# Patient Record
Sex: Female | Born: 1945
Health system: Southern US, Community
[De-identification: ages and names within clinical notes are randomized; demographics above are authoritative.]

## PROBLEM LIST (undated history)

## (undated) DIAGNOSIS — R10819 Abdominal tenderness, unspecified site: Secondary | ICD-10-CM

## (undated) DIAGNOSIS — E782 Mixed hyperlipidemia: Secondary | ICD-10-CM

## (undated) DIAGNOSIS — R9389 Abnormal findings on diagnostic imaging of other specified body structures: Secondary | ICD-10-CM

## (undated) DIAGNOSIS — K589 Irritable bowel syndrome without diarrhea: Secondary | ICD-10-CM

## (undated) DIAGNOSIS — M129 Arthropathy, unspecified: Secondary | ICD-10-CM

## (undated) DIAGNOSIS — R7302 Impaired glucose tolerance (oral): Secondary | ICD-10-CM

## (undated) DIAGNOSIS — K5730 Diverticulosis of large intestine without perforation or abscess without bleeding: Secondary | ICD-10-CM

## (undated) DIAGNOSIS — E785 Hyperlipidemia, unspecified: Secondary | ICD-10-CM

## (undated) DIAGNOSIS — M1990 Unspecified osteoarthritis, unspecified site: Secondary | ICD-10-CM

## (undated) DIAGNOSIS — R102 Pelvic and perineal pain: Secondary | ICD-10-CM

## (undated) DIAGNOSIS — I10 Essential (primary) hypertension: Secondary | ICD-10-CM

## (undated) DIAGNOSIS — N882 Stricture and stenosis of cervix uteri: Secondary | ICD-10-CM

## (undated) HISTORY — PX: NO PAST SURGERIES: SHX2092

## (undated) HISTORY — PX: COLONOSCOPY: SHX174

---

## 2005-12-18 ENCOUNTER — Ambulatory Visit: Payer: Self-pay | Admitting: Obstetrics and Gynecology

## 2011-07-04 ENCOUNTER — Ambulatory Visit: Payer: Self-pay | Admitting: Obstetrics and Gynecology

## 2011-07-12 ENCOUNTER — Ambulatory Visit: Payer: Self-pay | Admitting: Gastroenterology

## 2013-12-23 ENCOUNTER — Ambulatory Visit: Payer: Self-pay | Admitting: Obstetrics and Gynecology

## 2015-12-01 ENCOUNTER — Encounter: Payer: Self-pay | Admitting: *Deleted

## 2015-12-01 ENCOUNTER — Ambulatory Visit
Admission: EM | Admit: 2015-12-01 | Discharge: 2015-12-01 | Disposition: A | Discharge status: Home / Self Care | Payer: Managed Care, Other (non HMO) | Attending: Family Medicine | Admitting: Family Medicine

## 2015-12-01 DIAGNOSIS — J0100 Acute maxillary sinusitis, unspecified: Secondary | ICD-10-CM

## 2015-12-01 HISTORY — DX: Hyperlipidemia, unspecified: E78.5

## 2015-12-01 HISTORY — DX: Essential (primary) hypertension: I10

## 2015-12-01 HISTORY — DX: Unspecified osteoarthritis, unspecified site: M19.90

## 2015-12-01 MED ORDER — AZITHROMYCIN 250 MG PO TABS
ORAL_TABLET | ORAL | Status: DC
Start: 2015-12-01 — End: 2020-04-11

## 2015-12-01 MED ORDER — FLUTICASONE PROPIONATE 50 MCG/ACT NA SUSP: 2.0000 | Freq: Every day | NASAL | Status: DC

## 2015-12-01 NOTE — ED Provider Notes (Signed)
CSN: [id number]     Arrival date & time 12/01/15  [id number] History   First MD Initiated Contact with Patient 12/01/15 [phone]     Chief Complaint  Patient presents with  . Nasal Congestion  . Ear Fullness  . Dizziness   (Consider location/radiation/quality/duration/timing/severity/associated sxs/prior Treatment) HPI   70 year old female presents with nasal congestion headache and occasional dizziness for 2 weeks. She states that she had her children grandchildren and her husband were all sick over the holidays and hers has persisted. Ends of ear pressure that also will come and go. Her husband is chest cold has cleared but hers lingers. He has occasional cough but thinks that this is mostly from postnasal drip and she states she is not coughing excessively at nighttime.  Past Medical History  Diagnosis Date  . Arthritis   . Hypertension   . Hyperlipemia    History reviewed. No pertinent past surgical history. History reviewed. No pertinent family history. Social History  Substance Use Topics  . Smoking status: Never Smoker   . Smokeless tobacco: Never Used  . Alcohol Use: No   OB History    No data available     Review of Systems  Constitutional: Positive for chills. Negative for fever, diaphoresis, activity change and fatigue.  HENT: Positive for congestion, ear pain, postnasal drip, rhinorrhea, sinus pressure and sneezing.   Respiratory: Positive for cough. Negative for shortness of breath.   All other systems reviewed and are negative.   Allergies  Review of patient's allergies indicates no known allergies.  Home Medications   Prior to Admission medications   Medication Sig Start Date End Date Taking? Authorizing Provider  aspirin 81 MG tablet Take 81 mg by mouth daily.   Yes Historical Provider, MD  rosuvastatin (CRESTOR) 20 MG tablet Take 20 mg by mouth daily.   Yes Historical Provider, MD  triamterene-hydrochlorothiazide (MAXZIDE-25) 37.5-25 MG tablet Take 1 tablet by  mouth daily.   Yes Historical Provider, MD  azithromycin (ZITHROMAX Z-PAK) 250 MG tablet Use as per package instructions 12/01/15   Lutricia FeilWilliam P Chancellor Vanderloop, PA-C  fluticasone [facility](FLONASE) 50 MCG/ACT nasal spray Place 2 sprays into both nostrils daily. 12/01/15   Lutricia FeilWilliam P Ervin Hensley, PA-C   Meds Ordered and Administered this Visit  Medications - No data to display  BP 170/88 mmHg  Pulse 87  Temp(Src) 97.3 F (36.3 C) (Oral)  Resp 18  Ht 4\' 10"  (1.473 m)  Wt 135 lb (61.236 kg)  BMI 28.22 kg/m2  SpO2 100% No data found.   Physical Exam  Constitutional: She is oriented to person, place, and time. She appears well-developed and well-nourished. No distress.  HENT:  Head: Normocephalic and atraumatic.  Right Ear: External ear normal.  Left Ear: External ear normal.  Mouth/Throat: Oropharynx is clear and moist. No oropharyngeal exudate.  Eyes: Conjunctivae are normal. Pupils are equal, round, and reactive to light.  Neck: Normal range of motion. Neck supple.  Pulmonary/Chest: Breath sounds normal. No respiratory distress. She has no wheezes. She has no rales.  Musculoskeletal: Normal range of motion. She exhibits no edema or tenderness.  Neurological: She is alert and oriented to person, place, and time.  Skin: Skin is warm and dry. She is not diaphoretic.  Psychiatric: She has a normal mood and affect. Her behavior is normal. Judgment and thought content normal.  Nursing note and vitals reviewed.   ED Course  Procedures (including critical care time)  Labs Review Labs Reviewed - No data to display  Imaging Review No results found.   Visual Acuity Review  Right Eye Distance:   Left Eye Distance:   Bilateral Distance:    Right Eye Near:   Left Eye Near:    Bilateral Near:         MDM   1. Acute maxillary sinusitis, recurrence not specified    New Prescriptions   AZITHROMYCIN (ZITHROMAX Z-PAK) 250 MG TABLET    Use as per package instructions   FLUTICASONE (FLONASE) 50 MCG/ACT  NASAL SPRAY    Place 2 sprays into both nostrils daily.  Plan: 1.Diagnosis reviewed with patient 2. rx as per orders; risks, benefits, potential side effects reviewed with patient 3. Recommend supportive treatment with increased fluids and rest. Consider use of cool mist vaporizer at nighttime. Follow-up with Dr.Juengle , ENT if her dizziness and ear pressure persists. 4. F/u prn if symptoms worsen or don't improve     Lutricia Feil, PA-C 12/01/15 0951  Lutricia Feil, PA-C 12/01/15 1010

## 2015-12-01 NOTE — ED Notes (Signed)
Patient reports having nasal congestion and headaches for 2 weeks. Additional symptoms included ear pressure bilateral with some occasional dizziness.

## 2016-01-19 ENCOUNTER — Ambulatory Visit: Payer: Managed Care, Other (non HMO)

## 2016-01-19 ENCOUNTER — Ambulatory Visit
Admission: EM | Admit: 2016-01-19 | Discharge: 2016-01-19 | Disposition: A | Discharge status: Home / Self Care | Payer: Managed Care, Other (non HMO) | Attending: Family Medicine | Admitting: Family Medicine

## 2016-01-19 ENCOUNTER — Ambulatory Visit (INDEPENDENT_AMBULATORY_CARE_PROVIDER_SITE_OTHER): Payer: Managed Care, Other (non HMO)

## 2016-01-19 DIAGNOSIS — R52 Pain, unspecified: Secondary | ICD-10-CM | POA: Diagnosis not present

## 2016-01-19 DIAGNOSIS — J40 Bronchitis, not specified as acute or chronic: Secondary | ICD-10-CM | POA: Diagnosis not present

## 2016-01-19 DIAGNOSIS — J0111 Acute recurrent frontal sinusitis: Secondary | ICD-10-CM

## 2016-01-19 LAB — RAPID INFLUENZA A&B ANTIGENS (ARMC ONLY): INFLUENZA A (ARMC): NEGATIVE

## 2016-01-19 LAB — RAPID INFLUENZA A&B ANTIGENS: Influenza B (ARMC): NEGATIVE

## 2016-01-19 MED ORDER — AMOXICILLIN-POT CLAVULANATE 875-125 MG PO TABS: 1.0000 | ORAL_TABLET | Freq: Two times a day (BID) | ORAL | Status: DC

## 2016-01-19 MED ORDER — BENZONATATE 100 MG PO CAPS: 100.0000 mg | ORAL_CAPSULE | Freq: Three times a day (TID) | ORAL | Status: DC | PRN

## 2016-01-19 NOTE — ED Provider Notes (Signed)
[facility]  ____________________________________________  Time seen: Approximately 2:36 PM  I have reviewed the triage vital signs and the nursing notes.   HISTORY  Chief Complaint URI   HPI Tracy Nicholson is a 70 y.o. female presents with a complaint of 4 days of runny nose, cough, congestion, generalized chills and warm feeling as well as just generalized not feeling well. Patient reports that she has had several recent sicknesses. States in February she was treated with a Z-Pak for sinusitis. States that after her sinus infection she got better and then had a "bad cold" that lasted for a week or so and then resolved. Then states that she has been sick again since this past Sunday. Denies known fevers. Reports continues to eat and drink well. Patient states that she thought she had the flu as she was recently around her grandchildren that had the flu.  Reports does still have sinus drainage and some sinus pressure with greenish nasal drainage. Denies chest pain, shortness of breath, wheezing, weakness, dizziness, neck pain, back pain, hearing changes. Denies other recent antibiotic use.  PCP: Maryjane Hurter   Past Medical History  Diagnosis Date  . Arthritis   . Hypertension   . Hyperlipemia     There are no active problems to display for this patient.   History reviewed. No pertinent past surgical history.  Current Outpatient Rx  Name  Route  Sig  Dispense  Refill  . aspirin 81 MG tablet   Oral   Take 81 mg by mouth daily.         .           . fluticasone (FLONASE) 50 MCG/ACT nasal spray   Each Nare   Place 2 sprays into both nostrils daily.   16 g   0   . rosuvastatin (CRESTOR) 20 MG tablet   Oral   Take 20 mg by mouth daily.         Marland Kitchen triamterene-hydrochlorothiazide (MAXZIDE-25) 37.5-25 MG tablet   Oral   Take 1 tablet by mouth daily.           Allergies Review of patient's allergies indicates no known allergies.  Family History   Problem Relation Age of Onset  . Coronary artery disease Mother   . Stroke Father     Social History Social History  Substance Use Topics  . Smoking status: Never Smoker   . Smokeless tobacco: Never Used  . Alcohol Use: No    Review of Systems Constitutional: Positive chills. Eyes: No visual changes. ENT: No sore throat. Positive runny nose, nasal congestion, cough. Cardiovascular: Denies chest pain. Respiratory: Denies shortness of breath. Gastrointestinal: No abdominal pain.  No nausea, no vomiting.  No diarrhea.  No constipation. Genitourinary: Negative for dysuria. Musculoskeletal: Negative for back pain. Skin: Negative for rash. Neurological: Negative for headaches, focal weakness or numbness.  10-point ROS otherwise negative.  ____________________________________________   PHYSICAL EXAM:  VITAL SIGNS: ED Triage Vitals  Enc Vitals Group     BP 01/19/16 1348 159/77 mmHg     Pulse Rate 01/19/16 1348 86     Resp 01/19/16 1348 18     Temp 01/19/16 1348 99.1 F (37.3 C)     Temp Source 01/19/16 1348 Oral     SpO2 01/19/16 1348 100 %     Weight 01/19/16 1348 135 lb (61.236 kg)     Height 01/19/16 1348  (1.473 m)     Head Cir --  Peak Flow --      Pain Score 01/19/16 1351 0     Pain Loc --      Pain Edu? --      Excl. in [place]? --    Constitutional: Alert and oriented. Well appearing and in no acute distress. Eyes: Conjunctivae are normal. PERRL. EOMI. Head: Atraumatic. Mild  tender palpation frontal sinuses. No maxillary sinus tenderness palpation. No swelling. No erythema.  Ears: no erythema, normal TMs bilaterally.   Nose:Nasal congestion with clear rhinorrhea  Mouth/Throat: Mucous membranes are moist. No pharyngeal erythema. No tonsillar swelling or exudate.  Neck: No stridor.  No cervical spine tenderness to palpation. Hematological/Lymphatic/Immunilogical: No cervical lymphadenopathy. Cardiovascular: Normal rate, regular rhythm. Grossly normal  heart sounds.  Good peripheral circulation. Respiratory: Normal respiratory effort.  No retractions. Very mild scattered rhonchi. No wheezes or rales. Dry intermittent cough noted in room. Good air movement.  Gastrointestinal: Soft and nontender. Normal Bowel sounds. No CVA tenderness. Musculoskeletal: No lower or upper extremity tenderness nor edema. No cervical, thoracic or lumbar tenderness to palpation. Bilateral calfs nontender. Neurologic:  Normal speech and language. No gross focal neurologic deficits are appreciated. No gait instability. Skin:  Skin is warm, dry and intact. No rash noted. Psychiatric: Mood and affect are normal. Speech and behavior are normal.  ____________________________________________   LABS (all labs ordered are listed, but only abnormal results are displayed)  Labs Reviewed  RAPID INFLUENZA A&B ANTIGENS ([facility] ONLY)    RADIOLOGY  EXAM: CHEST 2 VIEW  COMPARISON: None.  FINDINGS: There is a small calcified granuloma in the left base region. There is no edema or consolidation. Heart size and pulmonary vascularity are normal. No adenopathy. There is degenerative change in the thoracic spine.  IMPRESSION: Small granuloma left base. No edema or consolidation.   Electronically Signed By: Bretta Bang III M.D. On: 01/19/2016 15:04  I, Renford Dills, personally viewed and evaluated these images (plain radiographs) as part of my medical decision making, as well as reviewing the written report by the radiologist ____________________________________________  INITIAL IMPRESSION / ASSESSMENT AND PLAN / ED COURSE  Pertinent labs & imaging results that were available during my care of the patient were reviewed by me and considered in my medical decision making (see chart for details).  Very well-appearing patient. No acute distress. Presents for the complaints of 4 days of runny nose, nasal congestion and cough. Scattered mild rhonchi. No  wheezes or rales. Good air movement. Abdomen soft nontender. Frontal sinus pressure and tenderness with continued nasal drainage. As with patient with cough and subjective report of fevers with multiple recent sicknesses will evaluate chest x-ray. Influenza negative.  Chest x-ray small granuloma left base, no edema or consolidation per radiology. Discussed xray report with patient, copy of report given to patient. Encouraged PCP follow up.   Suspect continued sinusitis, post z-pak, as well as with bronchitis. Will treat with oral augmentin and Tessalon Perles as needed. Encourage rest, fluids and PCP follow-up.  Discussed follow up with Primary care physician this week. Discussed follow up and return parameters including no resolution or any worsening concerns. Patient verbalized understanding and agreed to plan.Discussed indication, risks and benefits of medications with patient.    ____________________________________________   FINAL CLINICAL IMPRESSION(S) / ED DIAGNOSES  Final diagnoses:  Acute recurrent frontal sinusitis  Bronchitis      Note: This dictation was prepared with Dragon dictation along with smaller phrase technology. Any transcriptional errors that result from this process are unintentional.  Renford Dills, NP 01/19/16 1542

## 2016-01-19 NOTE — Discharge Instructions (Signed)
Take medication as prescribed. Rest. Drink plenty of fluids.  ° °Follow up with your primary care physician this week as needed. Return to Urgent care for new or worsening concerns.  ° ° °Sinusitis, Adult °Sinusitis is redness, soreness, and inflammation of the paranasal sinuses. Paranasal sinuses are air pockets within the bones of your face. They are located beneath your eyes, in the middle of your forehead, and above your eyes. In healthy paranasal sinuses, mucus is able to drain out, and air is able to circulate through them by way of your nose. However, when your paranasal sinuses are inflamed, mucus and air can become trapped. This can allow bacteria and other germs to grow and cause infection. °Sinusitis can develop quickly and last only a short time (acute) or continue over a long period (chronic). Sinusitis that lasts for more than 12 weeks is considered chronic. °CAUSES °Causes of sinusitis include: °· Allergies. °· Structural abnormalities, such as displacement of the cartilage that separates your nostrils (deviated septum), which can decrease the air flow through your nose and sinuses and affect sinus drainage. °· Functional abnormalities, such as when the small hairs (cilia) that line your sinuses and help remove mucus do not work properly or are not present. °SIGNS AND SYMPTOMS °Symptoms of acute and chronic sinusitis are the same. The primary symptoms are pain and pressure around the affected sinuses. Other symptoms include: °· Upper toothache. °· Earache. °· Headache. °· Bad breath. °· Decreased sense of smell and taste. °· A cough, which worsens when you are lying flat. °· Fatigue. °· Fever. °· Thick drainage from your nose, which often is green and may contain pus (purulent). °· Swelling and warmth over the affected sinuses. °DIAGNOSIS °Your health care provider will perform a physical exam. During your exam, your health care provider may perform any of the following to help determine if you have  acute sinusitis or chronic sinusitis: °· Look in your nose for signs of abnormal growths in your nostrils (nasal polyps). °· Tap over the affected sinus to check for signs of infection. °· View the inside of your sinuses using an imaging device that has a light attached (endoscope). °If your health care provider suspects that you have chronic sinusitis, one or more of the following tests may be recommended: °· Allergy tests. °· Nasal culture. A sample of mucus is taken from your nose, sent to a lab, and screened for bacteria. °· Nasal cytology. A sample of mucus is taken from your nose and examined by your health care provider to determine if your sinusitis is related to an allergy. °TREATMENT °Most cases of acute sinusitis are related to a viral infection and will resolve on their own within 10 days. Sometimes, medicines are prescribed to help relieve symptoms of both acute and chronic sinusitis. These may include pain medicines, decongestants, nasal steroid sprays, or saline sprays. °However, for sinusitis related to a bacterial infection, your health care provider will prescribe antibiotic medicines. These are medicines that will help kill the bacteria causing the infection. °Rarely, sinusitis is caused by a fungal infection. In these cases, your health care provider will prescribe antifungal medicine. °For some cases of chronic sinusitis, surgery is needed. Generally, these are cases in which sinusitis recurs more than 3 times per year, despite other treatments. °HOME CARE INSTRUCTIONS °· Drink plenty of water. Water helps thin the mucus so your sinuses can drain more easily. °· Use a humidifier. °· Inhale steam 3-4 times a day (for example, sit in   the bathroom with the shower running). °· Apply a warm, moist washcloth to your face 3-4 times a day, or as directed by your health care provider. °· Use saline nasal sprays to help moisten and clean your sinuses. °· Take medicines only as directed by your health care  provider. °· If you were prescribed either an antibiotic or antifungal medicine, finish it all even if you start to feel better. °SEEK IMMEDIATE MEDICAL CARE IF: °· You have increasing pain or severe headaches. °· You have nausea, vomiting, or drowsiness. °· You have swelling around your face. °· You have vision problems. °· You have a stiff neck. °· You have difficulty breathing. °  °This information is not intended to replace advice given to you by your health care provider. Make sure you discuss any questions you have with your health care provider. °  °Document Released: 11/05/2005 Document Revised: 11/26/2014 Document Reviewed: 11/20/2011 °Elsevier Interactive Patient Education ©2016 [employer] ° °

## 2016-01-19 NOTE — ED Notes (Signed)
Patient c/o cough, weakness, vomiting, body aches, and chills which started Sunday night.

## 2016-02-23 ENCOUNTER — Other Ambulatory Visit: Payer: Self-pay | Admitting: Obstetrics and Gynecology

## 2016-02-23 DIAGNOSIS — Z1231 Encounter for screening mammogram for malignant neoplasm of breast: Secondary | ICD-10-CM

## 2016-02-28 ENCOUNTER — Ambulatory Visit: Payer: Managed Care, Other (non HMO) | Attending: Obstetrics and Gynecology

## 2016-03-14 ENCOUNTER — Ambulatory Visit
Admission: RE | Admit: 2016-03-14 | Discharge: 2016-03-14 | Disposition: A | Discharge status: Home / Self Care | Payer: Managed Care, Other (non HMO) | Source: Ambulatory Visit | Attending: Obstetrics and Gynecology | Admitting: Obstetrics and Gynecology

## 2016-03-14 DIAGNOSIS — Z1231 Encounter for screening mammogram for malignant neoplasm of breast: Secondary | ICD-10-CM | POA: Insufficient documentation

## 2017-12-27 DIAGNOSIS — E782 Mixed hyperlipidemia: Secondary | ICD-10-CM | POA: Diagnosis not present

## 2017-12-27 DIAGNOSIS — I10 Essential (primary) hypertension: Secondary | ICD-10-CM | POA: Diagnosis not present

## 2017-12-27 DIAGNOSIS — M129 Arthropathy, unspecified: Secondary | ICD-10-CM | POA: Diagnosis not present

## 2018-07-04 DIAGNOSIS — I10 Essential (primary) hypertension: Secondary | ICD-10-CM | POA: Diagnosis not present

## 2018-07-04 DIAGNOSIS — Z0000 Encounter for general adult medical examination without abnormal findings: Secondary | ICD-10-CM | POA: Diagnosis not present

## 2018-07-04 DIAGNOSIS — E782 Mixed hyperlipidemia: Secondary | ICD-10-CM | POA: Diagnosis not present

## 2018-07-04 DIAGNOSIS — M129 Arthropathy, unspecified: Secondary | ICD-10-CM | POA: Diagnosis not present

## 2019-01-19 DIAGNOSIS — E782 Mixed hyperlipidemia: Secondary | ICD-10-CM | POA: Diagnosis not present

## 2019-01-19 DIAGNOSIS — I10 Essential (primary) hypertension: Secondary | ICD-10-CM | POA: Diagnosis not present

## 2019-01-19 DIAGNOSIS — M129 Arthropathy, unspecified: Secondary | ICD-10-CM | POA: Diagnosis not present

## 2019-07-30 DIAGNOSIS — I10 Essential (primary) hypertension: Secondary | ICD-10-CM | POA: Diagnosis not present

## 2019-07-30 DIAGNOSIS — Z0000 Encounter for general adult medical examination without abnormal findings: Secondary | ICD-10-CM | POA: Diagnosis not present

## 2019-07-30 DIAGNOSIS — E782 Mixed hyperlipidemia: Secondary | ICD-10-CM | POA: Diagnosis not present

## 2019-07-30 DIAGNOSIS — M129 Arthropathy, unspecified: Secondary | ICD-10-CM | POA: Diagnosis not present

## 2019-08-07 DIAGNOSIS — R7309 Other abnormal glucose: Secondary | ICD-10-CM | POA: Diagnosis not present

## 2020-02-12 DIAGNOSIS — E782 Mixed hyperlipidemia: Secondary | ICD-10-CM | POA: Diagnosis not present

## 2020-02-12 DIAGNOSIS — Z0000 Encounter for general adult medical examination without abnormal findings: Secondary | ICD-10-CM | POA: Diagnosis not present

## 2020-02-12 DIAGNOSIS — I10 Essential (primary) hypertension: Secondary | ICD-10-CM | POA: Diagnosis not present

## 2020-02-12 DIAGNOSIS — M129 Arthropathy, unspecified: Secondary | ICD-10-CM | POA: Diagnosis not present

## 2020-02-12 DIAGNOSIS — R7302 Impaired glucose tolerance (oral): Secondary | ICD-10-CM | POA: Diagnosis not present

## 2020-04-11 ENCOUNTER — Encounter: Payer: Self-pay | Admitting: Emergency Medicine

## 2020-04-11 ENCOUNTER — Ambulatory Visit
Admission: EM | Admit: 2020-04-11 | Discharge: 2020-04-11 | Disposition: A | Discharge status: Home / Self Care | Payer: Medicare HMO | Attending: Family Medicine | Admitting: Family Medicine

## 2020-04-11 ENCOUNTER — Other Ambulatory Visit: Payer: Self-pay

## 2020-04-11 DIAGNOSIS — J0100 Acute maxillary sinusitis, unspecified: Secondary | ICD-10-CM | POA: Diagnosis not present

## 2020-04-11 DIAGNOSIS — J40 Bronchitis, not specified as acute or chronic: Secondary | ICD-10-CM

## 2020-04-11 MED ORDER — DOXYCYCLINE HYCLATE 100 MG PO CAPS: 100.0000 mg | ORAL_CAPSULE | Freq: Two times a day (BID) | ORAL | 0 refills | Status: DC

## 2020-04-11 NOTE — ED Triage Notes (Signed)
Patient in today c/o cough, headache and fatigue x 3 weeks. Patient states the cough has gotten worse in the last week. Patient denies fever.  Patient has been taking OTC Tylenol and cough drops.   Patient has had both Pfizer covid vaccines completed in March 2021.

## 2020-04-11 NOTE — Discharge Instructions (Addendum)
Take medication as prescribed. Rest. Drink plenty of fluids. Over the counter mucinex.  Follow up with your primary care physician this week as needed. Return to Urgent care for new or worsening concerns. ;m

## 2020-04-11 NOTE — ED Provider Notes (Signed)
[facility] ____________________________________________  Time seen: Approximately 8:51 AM  I have reviewed the triage vital signs and the nursing notes.   HISTORY  Chief Complaint Cough, Headache, and Fatigue   HPI Tracy Nicholson is a 74 y.o. female presenting for evaluation of 3 weeks of cough and congestion complaints.  Patient reports cough and postnasal drainage has increased in the last 1 week.  Has started to notice she is having pressure discomfort around her cheeks bilaterally.  Has been having intermittent headaches.  Denies fevers.  States cough comes and goes, sometimes has coughing fits.  States feels sore from coughing.  Denies shortness of breath.  Sometimes coughing up mucus.  Denies known sick contacts.  Has had both COVID-19 vaccines.  Continues to drink fluids well, somewhat of a decreased appetite.  Has only taken over-the-counter occasional Tylenol, no other over-the-counter medications taken.  Denies other aggravating alleviating factors.  Reports otherwise doing well.   Past Medical History:  Diagnosis Date  . Arthritis   . Hyperlipemia   . Hypertension     There are no problems to display for this patient.   Past Surgical History:  Procedure Laterality Date  . NO PAST SURGERIES       No current facility-administered medications for this encounter.  Current Outpatient Medications:  .  aspirin 81 MG tablet, Take 81 mg by mouth daily., Disp: , Rfl:  .  rosuvastatin (CRESTOR) 20 MG tablet, Take 20 mg by mouth daily., Disp: , Rfl:  .  triamterene-hydrochlorothiazide (MAXZIDE-25) 37.5-25 MG tablet, Take 1 tablet by mouth daily., Disp: , Rfl:  .  doxycycline (VIBRAMYCIN) 100 MG capsule, Take 1 capsule (100 mg total) by mouth 2 (two) times daily., Disp: 20 capsule, Rfl: 0  Allergies Patient has no known allergies.  Family History  Problem Relation Age of Onset  . Coronary artery disease Mother   . Stroke Father     Social  History Social History   Tobacco Use  . Smoking status: Never Smoker  . Smokeless tobacco: Never Used  Substance Use Topics  . Alcohol use: No  . Drug use: No    Review of Systems Constitutional: No fever ENT: No sore throat.  Positive cough. Cardiovascular: Denies chest pain. Respiratory: Denies shortness of breath. Gastrointestinal: No abdominal pain.  No vomiting.  No diarrhea.   Musculoskeletal: Negative for back pain. Skin: Negative for rash.  ____________________________________________   PHYSICAL EXAM:  VITAL SIGNS: ED Triage Vitals  Enc Vitals Group     BP 04/11/20 0817 (!) 156/78     Pulse Rate 04/11/20 0817 84     Resp 04/11/20 0817 18     Temp 04/11/20 0817 98.4 F (36.9 C)     Temp Source 04/11/20 0817 Oral     SpO2 04/11/20 0817 100 %     Weight 04/11/20 0818 132 lb (59.9 kg)     Height 04/11/20 0818 4\' 8"  (1.422 m)     Head Circumference --      Peak Flow --      Pain Score 04/11/20 0818 6     Pain Loc --      Pain Edu? --      Excl. in [place]? --   Constitutional: Alert and oriented. Well appearing and in no acute distress. Eyes: Conjunctivae are normal. Head: Atraumatic.Mild to moderate tenderness to palpation bilateral maxillary sinuses.  No frontal sinus tenderness to palpation.  No swelling. No erythema.   Ears: no erythema, normal TMs  bilaterally.   Nose: nasal congestion with bilateral nasal turbinate erythema and edema.   Mouth/Throat: Mucous membranes are moist.  Oropharynx non-erythematous.No tonsillar swelling or exudate.  Neck: No stridor.  No cervical spine tenderness to palpation. Hematological/Lymphatic/Immunilogical: No cervical lymphadenopathy. Cardiovascular: Normal rate, regular rhythm. Grossly normal heart sounds.  Good peripheral circulation. Respiratory: Normal respiratory effort.  No retractions. No wheezes, rales or rhonchi. Good air movement.  Occasional dry cough in room. Musculoskeletal: Steady gait Neurologic:  Normal speech  and language.  No gait instability. Skin:  Skin is warm, dry and intact. No rash noted. Psychiatric: Mood and affect are normal. Speech and behavior are normal.  ___________________________________________   LABS (all labs ordered are listed, but only abnormal results are displayed)  Labs Reviewed - No data to display   PROCEDURES Procedures    INITIAL IMPRESSION / ASSESSMENT AND PLAN / ED COURSE  Pertinent labs & imaging results that were available during my care of the patient were reviewed by me and considered in my medical decision making (see chart for details).  Well-appearing patient.  Cough and congestion complaints for the last 3 weeks.  Lungs clear throughout at this time.  Suspect sinusitis with bronchitis.  Will treat with oral doxycycline.  Patient declined cough medication.  Discussed over-the-counter Mucinex.  Rest, fluids, supportive care.  Discussed indication, risks and benefits of medications with patient including photosensitivity.  Discussed follow up and return parameters including no resolution or any worsening concerns. Patient verbalized understanding and agreed to plan.   ____________________________________________   FINAL CLINICAL IMPRESSION(S) / ED DIAGNOSES  Final diagnoses:  Acute maxillary sinusitis, recurrence not specified  Bronchitis     ED Discharge Orders         Ordered    doxycycline (VIBRAMYCIN) 100 MG capsule  2 times daily     04/11/20 6294           Note: This dictation was prepared with Dragon dictation along with smaller phrase technology. Any transcriptional errors that result from this process are unintentional.         Renford Dills, NP 04/11/20 [id number]

## 2020-09-01 DIAGNOSIS — Z23 Encounter for immunization: Secondary | ICD-10-CM | POA: Diagnosis not present

## 2020-09-01 DIAGNOSIS — M129 Arthropathy, unspecified: Secondary | ICD-10-CM | POA: Diagnosis not present

## 2020-09-01 DIAGNOSIS — I10 Essential (primary) hypertension: Secondary | ICD-10-CM | POA: Diagnosis not present

## 2020-09-01 DIAGNOSIS — E782 Mixed hyperlipidemia: Secondary | ICD-10-CM | POA: Diagnosis not present

## 2020-09-01 DIAGNOSIS — R7302 Impaired glucose tolerance (oral): Secondary | ICD-10-CM | POA: Diagnosis not present

## 2021-03-17 DIAGNOSIS — E782 Mixed hyperlipidemia: Secondary | ICD-10-CM | POA: Diagnosis not present

## 2021-03-17 DIAGNOSIS — I10 Essential (primary) hypertension: Secondary | ICD-10-CM | POA: Diagnosis not present

## 2021-03-17 DIAGNOSIS — R7302 Impaired glucose tolerance (oral): Secondary | ICD-10-CM | POA: Diagnosis not present

## 2021-03-17 DIAGNOSIS — Z0000 Encounter for general adult medical examination without abnormal findings: Secondary | ICD-10-CM | POA: Diagnosis not present

## 2021-03-17 DIAGNOSIS — M129 Arthropathy, unspecified: Secondary | ICD-10-CM | POA: Diagnosis not present

## 2021-06-26 ENCOUNTER — Ambulatory Visit (INDEPENDENT_AMBULATORY_CARE_PROVIDER_SITE_OTHER): Payer: Medicare HMO

## 2021-06-26 ENCOUNTER — Other Ambulatory Visit: Payer: Self-pay

## 2021-06-26 ENCOUNTER — Ambulatory Visit
Admission: EM | Admit: 2021-06-26 | Discharge: 2021-06-26 | Disposition: A | Discharge status: Home / Self Care | Payer: Medicare HMO | Attending: Family Medicine | Admitting: Family Medicine

## 2021-06-26 DIAGNOSIS — M542 Cervicalgia: Secondary | ICD-10-CM

## 2021-06-26 MED ORDER — BACLOFEN 10 MG PO TABS: 5.0000 mg | ORAL_TABLET | Freq: Every evening | ORAL | 0 refills | Status: DC | PRN

## 2021-06-26 MED ORDER — MELOXICAM 7.5 MG PO TABS: 7.5000 mg | ORAL_TABLET | Freq: Every day | ORAL | 0 refills | Status: DC

## 2021-06-26 NOTE — Discharge Instructions (Addendum)
Rest.  Lots of heat.  Medication as directed.  Follow up with your PCP.  Take care  Dr. Adriana Simas

## 2021-06-26 NOTE — ED Triage Notes (Signed)
Pt here with C/O right sided neck pain for a couple weeks, has been getting worst over the last 36hours, has taken tylenol with no relief.

## 2021-06-26 NOTE — ED Provider Notes (Signed)
[facility]    CSN: [id number] Arrival date & time: 06/26/21  0801      History   Chief Complaint Neck pain  HPI  75 year old female with a history of arthritis, hypertension, hyperlipidemia presents with the above complaint.  Patient reports ongoing neck pain for the past few weeks.  Pain is located on the right side of the neck, mostly posterior.  Some radiation down the right upper arm.  She states that it has been worse over the past 24 hours.  She has taken Tylenol with some improvement but no resolution.  Pain 7/10 in severity.  Patient denies chest pain but does report some chest tightness.  Patient states that she has been experiencing fatigue.  She states that she normally takes a daily walk but does not feel she can complete without stopping as she normally does.  No reports of shortness of breath.  She does note that she has had an occasional cough and also has had some sore throat.  She is unsure if this is contributing.  Patient also notes a recent tick bite as well.  Again, patient is unclear if this is contributing.  Past Medical History:  Diagnosis Date   Arthritis    Hyperlipemia    Hypertension    Past Surgical History:  Procedure Laterality Date   NO PAST SURGERIES      OB History   No obstetric history on file.      Home Medications    Prior to Admission medications   Medication Sig Start Date End Date Taking? Authorizing Provider  aspirin 81 MG tablet Take 81 mg by mouth daily.   Yes [provider]  baclofen (LIORESAL) 10 MG tablet Take 0.5 tablets (5 mg total) by mouth at bedtime as needed for muscle spasms. 06/26/21  Yes Ayshia Gramlich G, DO  meloxicam (MOBIC) 7.5 MG tablet Take 1 tablet (7.5 mg total) by mouth daily. 06/26/21  Yes Paeton Studer G, DO  rosuvastatin (CRESTOR) 20 MG tablet Take 20 mg by mouth daily.   Yes [provider]  triamterene-hydrochlorothiazide (MAXZIDE-25) 37.5-25 MG tablet Take 1 tablet by mouth daily.    Yes [provider]  doxycycline (VIBRAMYCIN) 100 MG capsule Take 1 capsule (100 mg total) by mouth 2 (two) times daily. 04/11/20   Renford Dills, NP  fluticasone [facility]) 50 MCG/ACT nasal spray Place 2 sprays into both nostrils daily. 12/01/15 04/11/20  Lutricia Feil, PA-C    Family History Family History  Problem Relation Age of Onset   Coronary artery disease Mother    Stroke Father     Social History Social History   Tobacco Use   Smoking status: Never   Smokeless tobacco: Never  Vaping Use   Vaping Use: Never used  Substance Use Topics   Alcohol use: No   Drug use: No     Allergies   Patient has no known allergies.   Review of Systems Review of Systems Per HPI  Physical Exam Triage Vital Signs ED Triage Vitals  Enc Vitals Group     BP 06/26/21 0824 (!) 166/79     Pulse Rate 06/26/21 0824 76     Resp 06/26/21 0824 16     Temp 06/26/21 0824 98.9 F (37.2 C)     Temp Source 06/26/21 0824 Oral     SpO2 06/26/21 0824 96 %     Weight 06/26/21 0823 130 lb (59 kg)     Height 06/26/21 0823 4\' 8"  (  1.422 m)     Head Circumference --      Peak Flow --      Pain Score 06/26/21 0822 7     Pain Loc --      Pain Edu? --      Excl. in [place]? --    Updated Vital Signs BP (!) 166/79 (BP Location: Left Arm) Comment: BP always high at Dr  Pulse 76   Temp 98.9 F (37.2 C) (Oral)   Resp 16   Ht 4\' 8"  (1.422 m)   Wt 59 kg   SpO2 96%   BMI 29.15 kg/m   Visual Acuity Right Eye Distance:   Left Eye Distance:   Bilateral Distance:    Right Eye Near:   Left Eye Near:    Bilateral Near:     Physical Exam Vitals and nursing note reviewed.  Constitutional:      General: She is not in acute distress.    Appearance: Normal appearance. She is not ill-appearing.  HENT:     Head: Normocephalic and atraumatic.     Right Ear: Tympanic membrane normal.     Left Ear: Tympanic membrane normal.     Mouth/Throat:     Pharynx: Oropharynx is clear.  Eyes:      General:        Right eye: No discharge.        Left eye: No discharge.     Conjunctiva/sclera: Conjunctivae normal.  Neck:     Comments: Tenderness over the right trapezius. Cardiovascular:     Rate and Rhythm: Normal rate and regular rhythm.     Heart sounds: No murmur heard. Pulmonary:     Effort: Pulmonary effort is normal.     Breath sounds: Normal breath sounds. No wheezing, rhonchi or rales.  Musculoskeletal:     Comments: Tenderness to palpation around the right scapula.  Neurological:     Mental Status: She is alert.   UC Treatments / Results  Labs (all labs ordered are listed, but only abnormal results are displayed) Labs Reviewed - No data to display  EKG Interpretation: Normal sinus rhythm with a rate of 71.  Right bundle branch block.  No prior EKGs for comparison.  Radiology DG Cervical Spine Complete  Result Date: 06/26/2021 CLINICAL DATA:  Acute right-sided neck pain. EXAM: CERVICAL SPINE - COMPLETE 4+ VIEW COMPARISON:  None. FINDINGS: Mild grade 1 anterolisthesis of C4-5 is noted secondary to posterior facet joint hypertrophy. No fracture is noted. Moderate degenerative disc disease is noted at C3-4, C4-5, C5-6, C6-7 and C7-T1. No prevertebral soft tissue swelling is noted. Moderate bilateral neural foraminal stenosis is noted at C4-5 secondary to uncovertebral spurring. IMPRESSION: Moderate multilevel degenerative disc disease. No acute abnormality is noted. Electronically Signed   By: 08/26/2021 M.D.   On: 06/26/2021 09:16    Procedures Procedures (including critical care time)  Medications Ordered in UC Medications - No data to display  Initial Impression / Assessment and Plan / UC Course  I have reviewed the triage vital signs and the nursing notes.  Pertinent labs & imaging results that were available during my care of the patient were reviewed by me and considered in my medical decision making (see chart for details).    75 year old female presents  with neck pain. Patient concerned about MI given family history.  EKG was obtained.  EKG revealed right bundle-block but was otherwise unremarkable.  No EKGs for comparison.  I do not feel that  this is cardiac in nature.  X-ray of the cervical spine was obtained and was independently reviewed by me.  Interpretation: Multilevel degenerative changes noted.  Patient's neck pain appears to be musculoskeletal in origin.  Treating with meloxicam and baclofen.  Advised follow-up with primary care provider.  If her symptoms persist, PCP can consider physical therapy and/or referral.  Final Clinical Impressions(s) / UC Diagnoses   Final diagnoses:  Neck pain     Discharge Instructions      Rest.  Lots of heat.  Medication as directed.  Follow up with your PCP.  Take care  Dr. Adriana Simas      ED Prescriptions     Medication Sig Dispense Auth. Provider   meloxicam (MOBIC) 7.5 MG tablet Take 1 tablet (7.5 mg total) by mouth daily. 14 tablet Dejean Tribby G, DO   baclofen (LIORESAL) 10 MG tablet Take 0.5 tablets (5 mg total) by mouth at bedtime as needed for muscle spasms. 10 each Tommie Sams, DO      PDMP not reviewed this encounter.   Everlene Other [place], [place] 06/26/21 [phone]

## 2021-09-28 DIAGNOSIS — M129 Arthropathy, unspecified: Secondary | ICD-10-CM | POA: Diagnosis not present

## 2021-09-28 DIAGNOSIS — R7302 Impaired glucose tolerance (oral): Secondary | ICD-10-CM | POA: Diagnosis not present

## 2021-09-28 DIAGNOSIS — I10 Essential (primary) hypertension: Secondary | ICD-10-CM | POA: Diagnosis not present

## 2021-09-28 DIAGNOSIS — E782 Mixed hyperlipidemia: Secondary | ICD-10-CM | POA: Diagnosis not present

## 2022-01-01 DIAGNOSIS — H2513 Age-related nuclear cataract, bilateral: Secondary | ICD-10-CM | POA: Diagnosis not present

## 2022-01-01 DIAGNOSIS — H353131 Nonexudative age-related macular degeneration, bilateral, early dry stage: Secondary | ICD-10-CM | POA: Diagnosis not present

## 2022-01-01 DIAGNOSIS — H524 Presbyopia: Secondary | ICD-10-CM | POA: Diagnosis not present

## 2022-04-13 DIAGNOSIS — M129 Arthropathy, unspecified: Secondary | ICD-10-CM | POA: Diagnosis not present

## 2022-04-13 DIAGNOSIS — Z0000 Encounter for general adult medical examination without abnormal findings: Secondary | ICD-10-CM | POA: Diagnosis not present

## 2022-04-13 DIAGNOSIS — E782 Mixed hyperlipidemia: Secondary | ICD-10-CM | POA: Diagnosis not present

## 2022-04-13 DIAGNOSIS — Z1389 Encounter for screening for other disorder: Secondary | ICD-10-CM | POA: Diagnosis not present

## 2022-04-13 DIAGNOSIS — R7302 Impaired glucose tolerance (oral): Secondary | ICD-10-CM | POA: Diagnosis not present

## 2022-04-13 DIAGNOSIS — I10 Essential (primary) hypertension: Secondary | ICD-10-CM | POA: Diagnosis not present

## 2022-05-12 ENCOUNTER — Ambulatory Visit
Admission: EM | Admit: 2022-05-12 | Discharge: 2022-05-12 | Disposition: A | Discharge status: Home / Self Care | Payer: Medicare HMO | Attending: Urgent Care | Admitting: Urgent Care

## 2022-05-12 ENCOUNTER — Other Ambulatory Visit: Payer: Self-pay

## 2022-05-12 DIAGNOSIS — N3000 Acute cystitis without hematuria: Secondary | ICD-10-CM

## 2022-05-12 LAB — URINALYSIS, MICROSCOPIC (REFLEX)

## 2022-05-12 LAB — URINALYSIS, ROUTINE W REFLEX MICROSCOPIC
Bilirubin Urine: NEGATIVE
Glucose, UA: NEGATIVE mg/dL
Ketones, ur: NEGATIVE mg/dL
Nitrite: NEGATIVE
Protein, ur: NEGATIVE mg/dL
Specific Gravity, Urine: 1.015 (ref 1.005–1.030)
pH: 7.5 (ref 5.0–8.0)

## 2022-05-12 MED ORDER — NITROFURANTOIN MONOHYD MACRO 100 MG PO CAPS: 100.0000 mg | ORAL_CAPSULE | Freq: Two times a day (BID) | ORAL | 0 refills | Status: DC

## 2022-08-26 IMAGING — CR DG CERVICAL SPINE COMPLETE 4+V
6 series · 7 of 7 positions shown · non-contrast
Comparison: None.

CLINICAL DATA: Acute right-sided neck pain.

EXAM:
CERVICAL SPINE - COMPLETE 4+ VIEW

[c-spine lat]
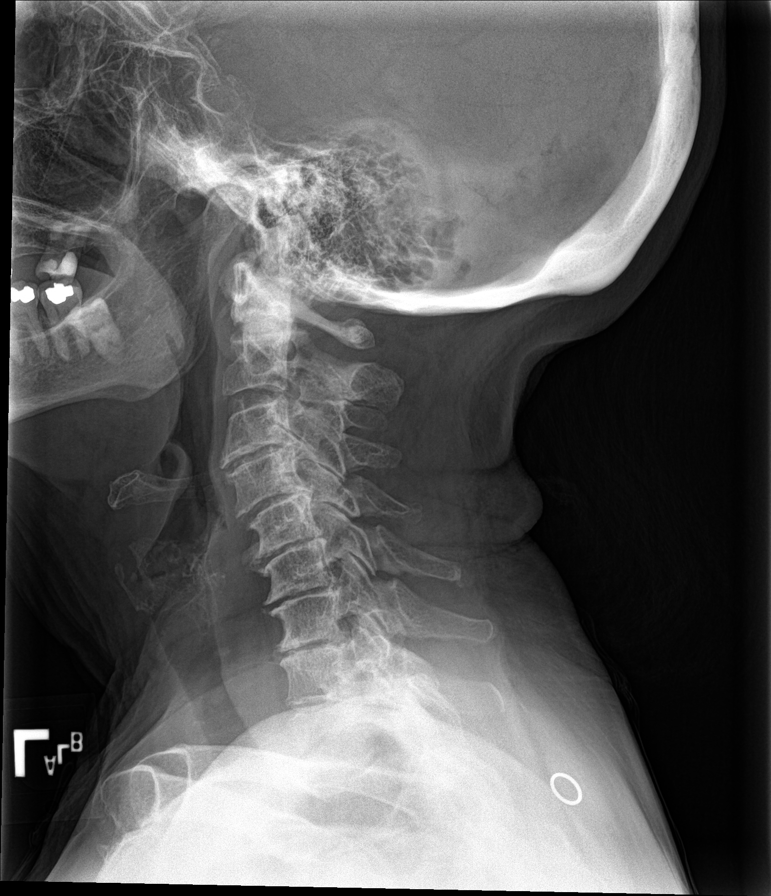

[c-spine obl (1 of 2)]
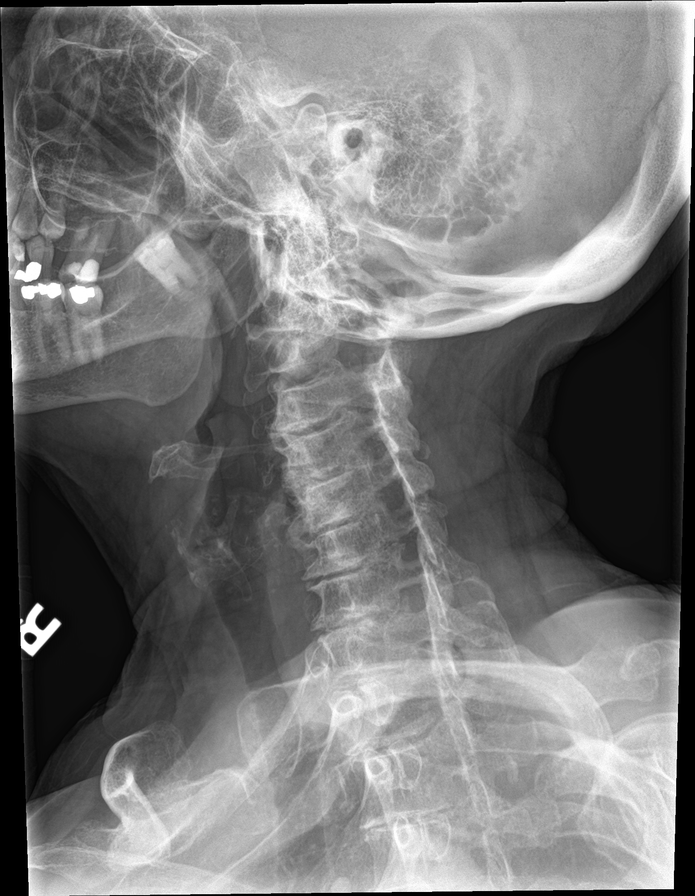

[c-spine obl (2 of 2)]
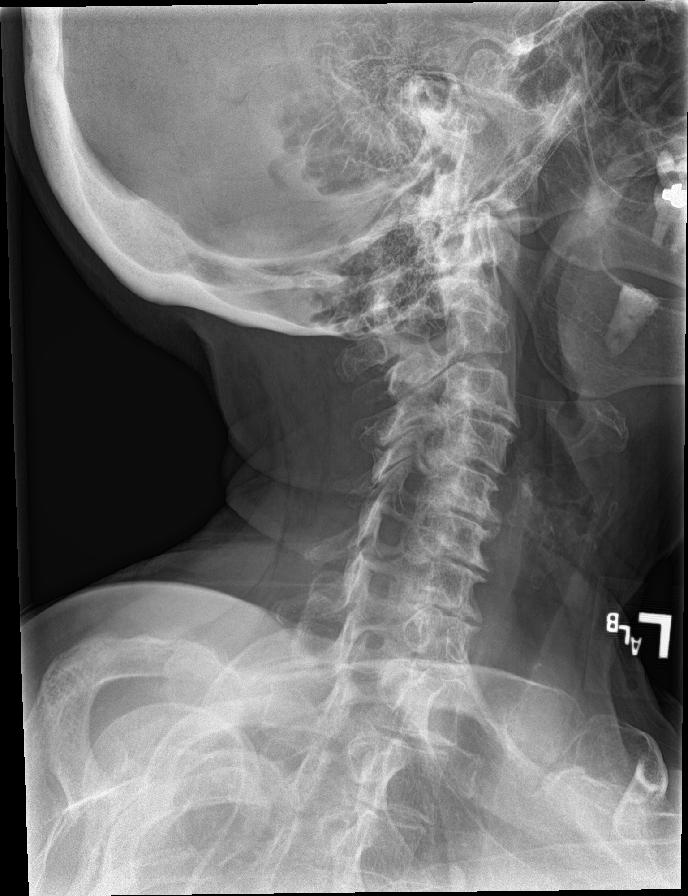

[c-spine ap]
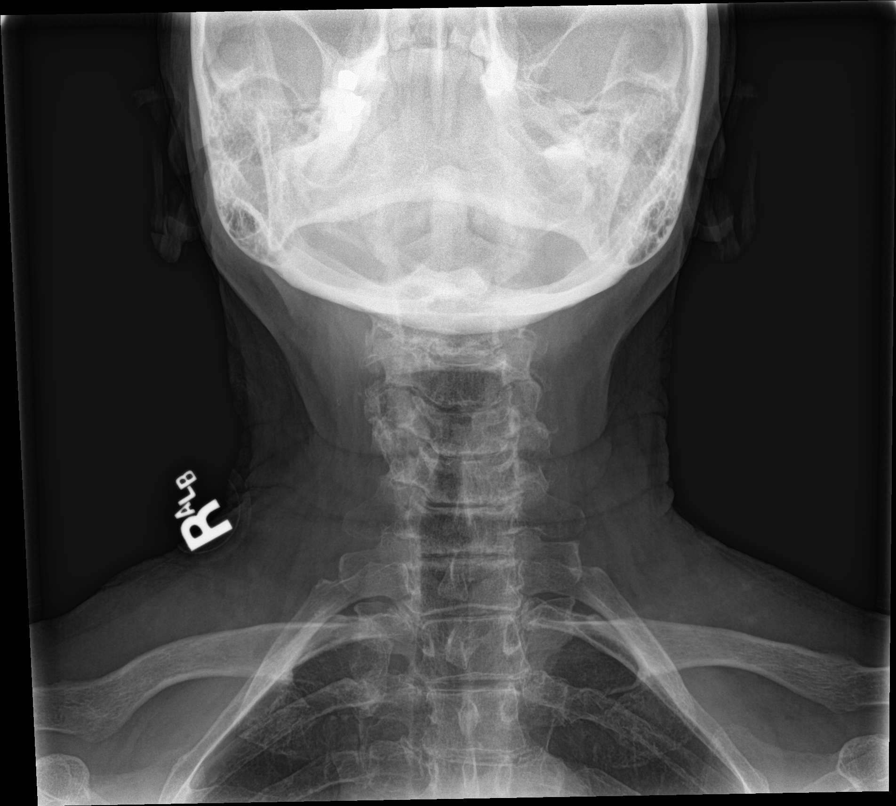

[Series 5: c-spine open mouth · 0.14mm/px · 2 of 2 slices shown]
[im 1/2]
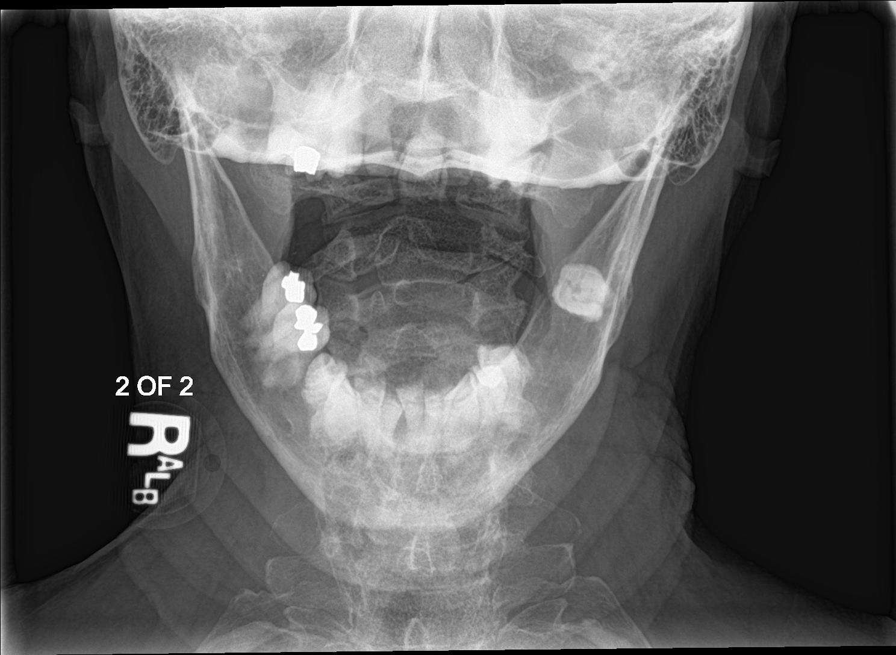
[im 2/2]
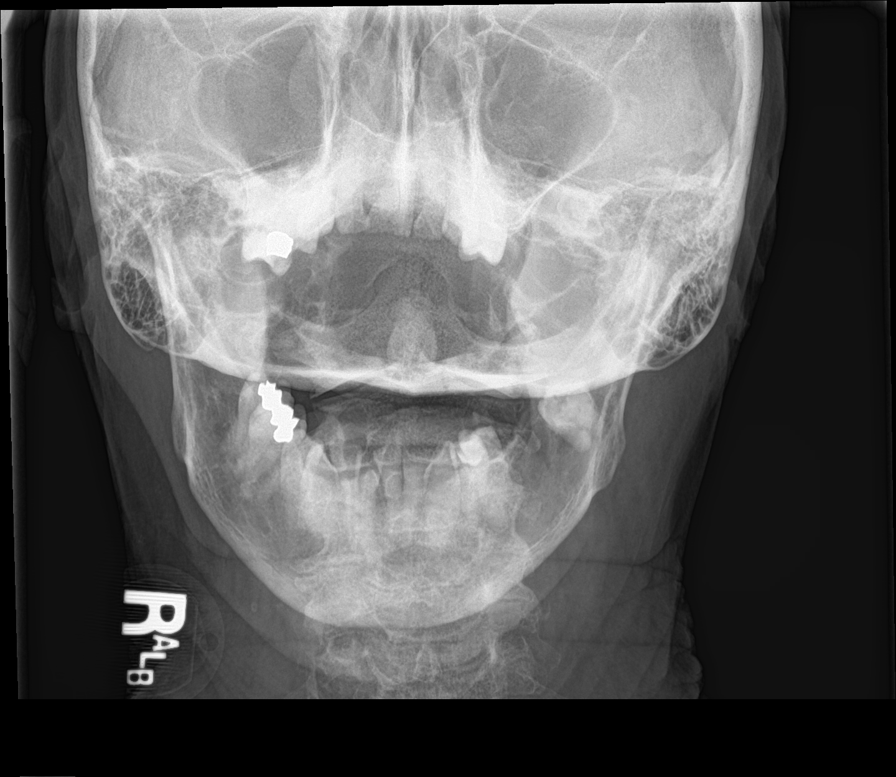

[[person_name]]
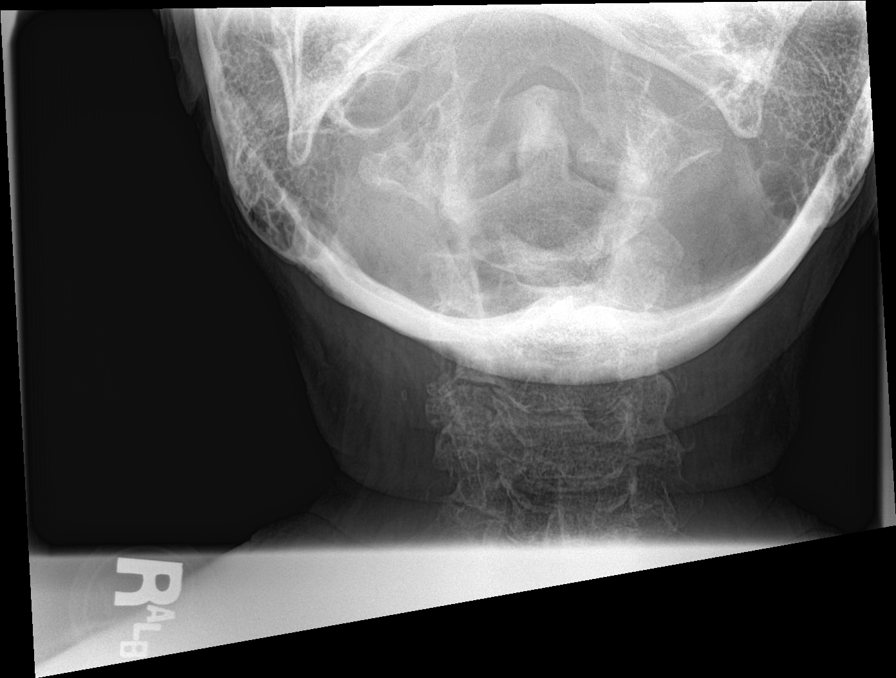

[7 of 7 positions shown; findings below may reference images not displayed]

FINDINGS: Mild grade 1 anterolisthesis of C4-5 is noted secondary to posterior
facet joint hypertrophy. No fracture is noted. Moderate degenerative
disc disease is noted at C3-4, C4-5, C5-6, C6-7 and C7-T1. No
prevertebral soft tissue swelling is noted. Moderate bilateral
neural foraminal stenosis is noted at C4-5 secondary to
uncovertebral spurring.
IMPRESSION: Moderate multilevel degenerative disc disease. No acute abnormality
is noted.

## 2022-09-15 ENCOUNTER — Ambulatory Visit
Admission: EM | Admit: 2022-09-15 | Discharge: 2022-09-15 | Disposition: A | Discharge status: Home / Self Care | Payer: Medicare HMO | Attending: Physician Assistant | Admitting: Physician Assistant

## 2022-09-15 ENCOUNTER — Encounter: Payer: Self-pay | Admitting: Emergency Medicine

## 2022-09-15 DIAGNOSIS — U071 COVID-19: Secondary | ICD-10-CM | POA: Diagnosis not present

## 2022-09-15 DIAGNOSIS — J209 Acute bronchitis, unspecified: Secondary | ICD-10-CM | POA: Diagnosis not present

## 2022-09-15 DIAGNOSIS — R051 Acute cough: Secondary | ICD-10-CM

## 2022-09-15 LAB — SARS CORONAVIRUS 2 BY RT PCR: SARS Coronavirus 2 by RT PCR: POSITIVE — AB

## 2022-09-15 MED ORDER — CHERATUSSIN AC 100-10 MG/5ML PO SOLN: 5.0000 mL | Freq: Four times a day (QID) | ORAL | 0 refills | Status: DC | PRN

## 2022-09-15 MED ORDER — MOLNUPIRAVIR 200 MG PO CAPS: 4.0000 | ORAL_CAPSULE | Freq: Two times a day (BID) | ORAL | 0 refills | Status: AC

## 2022-09-15 NOTE — ED Provider Notes (Signed)
[facility]    CSN: [id number] Arrival date & time: 09/15/22  0800      History   Chief Complaint Chief Complaint  Patient presents with   Cough    HPI Tracy Nicholson is a 76 y.o. female presenting for 10-day history of cough and chest congestion.  Patient reports that she is feeling better until yesterday when her symptoms got worse.  Now she complains of feeling fatigued and having headaches.  Cough is productive but she is not sure what color the sputum is.  She denies any associated fevers, body aches, sore throat, nasal congestion but has had some sinus pressure.  No wheezing or breathing difficulty, vomiting or diarrhea.  Concerned about the possibility of bronchitis.  She also reports that her husband tested positive for COVID yesterday but he just got sick.  She did not have another COVID test to test herself.  Has tried OTC cough meds without improvement in symptoms.  History recently of hypertension and hyperlipidemia.  HPI  Past Medical History:  Diagnosis Date   Arthritis    Hyperlipemia    Hypertension     There are no problems to display for this patient.   Past Surgical History:  Procedure Laterality Date   NO PAST SURGERIES      OB History   No obstetric history on file.      Home Medications    Prior to Admission medications   Medication Sig Start Date End Date Taking? Authorizing Provider  aspirin 81 MG tablet Take 81 mg by mouth daily.   Yes [provider]  guaiFENesin-codeine (CHERATUSSIN AC) 100-10 MG/5ML syrup Take 5 mLs by mouth 4 (four) times daily as needed for cough. 09/15/22  Yes Danton Clap, PA-C  molnupiravir EUA (LAGEVRIO) 200 MG CAPS capsule Take 4 capsules (800 mg total) by mouth 2 (two) times daily for 5 days. 09/15/22 09/20/22 Yes Laurene Footman B, PA-C  rosuvastatin (CRESTOR) 20 MG tablet Take 20 mg by mouth daily.   Yes [provider]  triamterene-hydrochlorothiazide (MAXZIDE-25) 37.5-25 MG  tablet Take 1 tablet by mouth daily.   Yes [provider]  meloxicam (MOBIC) 7.5 MG tablet Take 1 tablet (7.5 mg total) by mouth daily. 06/26/21   Coral Spikes, DO  fluticasone (FLONASE) 50 MCG/ACT nasal spray Place 2 sprays into both nostrils daily. 12/01/15 04/11/20  Lorin Picket, PA-C    Family History Family History  Problem Relation Age of Onset   Coronary artery disease Mother    Stroke Father     Social History Social History   Tobacco Use   Smoking status: Never   Smokeless tobacco: Never  Vaping Use   Vaping Use: Never used  Substance Use Topics   Alcohol use: No   Drug use: No     Allergies   Patient has no known allergies.   Review of Systems Review of Systems  Constitutional:  Positive for fatigue. Negative for chills, diaphoresis and fever.  HENT:  Positive for congestion and sinus pressure. Negative for ear pain, rhinorrhea, sinus pain and sore throat.   Respiratory:  Positive for cough. Negative for shortness of breath.   Cardiovascular:  Negative for chest pain.  Gastrointestinal:  Negative for abdominal pain, nausea and vomiting.  Musculoskeletal:  Negative for arthralgias and myalgias.  Skin:  Negative for rash.  Neurological:  Positive for headaches. Negative for weakness.  Hematological:  Negative for adenopathy.     Physical Exam Triage Vital  Signs ED Triage Vitals  Enc Vitals Group     BP 09/15/22 0811 (!) 160/77     Pulse Rate 09/15/22 0811 90     Resp 09/15/22 0811 14     Temp 09/15/22 0811 98.3 F (36.8 C)     Temp Source 09/15/22 0811 Oral     SpO2 09/15/22 0811 98 %     Weight 09/15/22 0808 127 lb (57.6 kg)     Height 09/15/22 0808 4\' 8"  (1.422 m)     Head Circumference --      Peak Flow --      Pain Score 09/15/22 0808 0     Pain Loc --      Pain Edu? --      Excl. in [place]? --    No data found.  Updated Vital Signs BP (!) 160/77 (BP Location: Left Arm)   Pulse 90   Temp 98.3 F (36.8 C) (Oral)   Resp 14   Ht  4\' 8"  (1.422 m)   Wt 127 lb (57.6 kg)   SpO2 98%   BMI 28.47 kg/m      Physical Exam Vitals and nursing note reviewed.  Constitutional:      General: She is not in acute distress.    Appearance: Normal appearance. She is not ill-appearing or toxic-appearing.  HENT:     Head: Normocephalic and atraumatic.     Nose: Congestion present.     Mouth/Throat:     Mouth: Mucous membranes are moist.     Pharynx: Oropharynx is clear.  Eyes:     General: No scleral icterus.       Right eye: No discharge.        Left eye: No discharge.     Conjunctiva/sclera: Conjunctivae normal.  Cardiovascular:     Rate and Rhythm: Normal rate and regular rhythm.     Heart sounds: Normal heart sounds.  Pulmonary:     Effort: Pulmonary effort is normal. No respiratory distress.     Breath sounds: Rhonchi (few scattered) present.  Musculoskeletal:     Cervical back: Neck supple.  Skin:    General: Skin is dry.  Neurological:     General: No focal deficit present.     Mental Status: She is alert. Mental status is at baseline.     Motor: No weakness.     Gait: Gait normal.  Psychiatric:        Mood and Affect: Mood normal.        Behavior: Behavior normal.        Thought Content: Thought content normal.      UC Treatments / Results  Labs (all labs ordered are listed, but only abnormal results are displayed) Labs Reviewed  SARS CORONAVIRUS 2 BY RT PCR - Abnormal; Notable for the following components:      Result Value   SARS Coronavirus 2 by RT PCR POSITIVE (*)    All other components within normal limits    EKG   Radiology No results found.  Procedures Procedures (including critical care time)  Medications Ordered in UC Medications - No data to display  Initial Impression / Assessment and Plan / UC Course  I have reviewed the triage vital signs and the nursing notes.  Pertinent labs & imaging results that were available during my care of the patient were reviewed by me and  considered in my medical decision making (see chart for details).   76 year old female presents for fatigue, headaches,  cough and congestion.  Initial symptom onset 10 days ago but symptoms had improved until yesterday when they worsen.  Her husband also tested positive for COVID yesterday.  BP elevated at 160/77.  Other vitals normal and stable.  Patient is overall well-appearing.  On exam she has nasal congestion which is minimal.  She also has few scattered rhonchi which are also minimal and mostly clear with cough.  No respiratory distress.  PCR COVID is obtained.  Advised patient that it is possible she could have recently picked up [place] since her symptoms acutely worsen or may be that she has had COVID this entire time.  I will be uncertain if her test is positive.  PCR COVID test is positive.  Discussed results with patient.  Suspect she may have gotten COVID yesterday when symptoms acutely worsened.  That is when the headaches and increased fatigue began.  We will have her isolate 5 days from yesterday and wear mask x5 days.  We will treat with molnupiravir.  Plenty of rest and fluids.  Offered her prescription cough medication which I did send but she later declined.  Advised her to return or go to ER if she has a fever or worsening cough or breathing difficulty.   Final Clinical Impressions(s) / UC Diagnoses   Final diagnoses:  COVID-19  Acute cough  Acute bronchitis, unspecified organism     Discharge Instructions      -Your COVID test was positive.  Since her symptoms recently worsened, it is possible that you are COVID infection just started yesterday.  You should be on the safe side and isolate 5 days from yesterday and wear a mask for 5 days.  We will also cover you with an antiviral medication to try to ensure things do not get worse.  We also discussed the possibility that you may have had COVID this entire time.  In either case you should be feeling better over the next  week.  I have also sent cough medication.  You should increase your rest and fluids.  Take Tylenol as needed for discomfort. - If you develop a fever or have worsening cough or shortness of breath you should return or go to ER.     ED Prescriptions     Medication Sig Dispense Auth. Provider   molnupiravir EUA (LAGEVRIO) 200 MG CAPS capsule Take 4 capsules (800 mg total) by mouth 2 (two) times daily for 5 days. 40 capsule Laurene Footman B, PA-C   guaiFENesin-codeine (CHERATUSSIN AC) 100-10 MG/5ML syrup Take 5 mLs by mouth 4 (four) times daily as needed for cough. 120 mL Danton Clap, PA-C      I have reviewed the PDMP during this encounter.   Laurene Footman B, PA-C 09/15/22 0930

## 2022-09-15 NOTE — Discharge Instructions (Signed)
-  Your COVID test was positive.  Since her symptoms recently worsened, it is possible that you are COVID infection just started yesterday.  You should be on the safe side and isolate 5 days from yesterday and wear a mask for 5 days.  We will also cover you with an antiviral medication to try to ensure things do not get worse.  We also discussed the possibility that you may have had COVID this entire time.  In either case you should be feeling better over the next week.  I have also sent cough medication.  You should increase your rest and fluids.  Take Tylenol as needed for discomfort. - If you develop a fever or have worsening cough or shortness of breath you should return or go to ER.

## 2022-09-15 NOTE — ED Triage Notes (Signed)
Patient c/o cough and chest congestion for 10 days.  Patient denies fevers.

## 2022-10-29 DIAGNOSIS — Z2821 Immunization not carried out because of patient refusal: Secondary | ICD-10-CM | POA: Diagnosis not present

## 2022-10-29 DIAGNOSIS — I10 Essential (primary) hypertension: Secondary | ICD-10-CM | POA: Diagnosis not present

## 2022-10-29 DIAGNOSIS — M1990 Unspecified osteoarthritis, unspecified site: Secondary | ICD-10-CM | POA: Diagnosis not present

## 2022-10-29 DIAGNOSIS — E782 Mixed hyperlipidemia: Secondary | ICD-10-CM | POA: Diagnosis not present

## 2022-10-29 DIAGNOSIS — R7302 Impaired glucose tolerance (oral): Secondary | ICD-10-CM | POA: Diagnosis not present

## 2022-11-15 DIAGNOSIS — H353111 Nonexudative age-related macular degeneration, right eye, early dry stage: Secondary | ICD-10-CM | POA: Diagnosis not present

## 2022-12-05 DIAGNOSIS — H2511 Age-related nuclear cataract, right eye: Secondary | ICD-10-CM | POA: Diagnosis not present

## 2022-12-18 ENCOUNTER — Encounter: Payer: Self-pay | Admitting: Ophthalmology

## 2022-12-24 NOTE — Discharge Instructions (Signed)

## 2022-12-25 ENCOUNTER — Other Ambulatory Visit: Payer: Self-pay

## 2022-12-25 ENCOUNTER — Encounter: Payer: Self-pay | Admitting: Ophthalmology

## 2022-12-25 ENCOUNTER — Encounter
Admission: RE | Disposition: A | Discharge status: Home / Self Care | Payer: Self-pay | Source: Home / Self Care | Attending: Ophthalmology

## 2022-12-25 ENCOUNTER — Ambulatory Visit
Admission: RE | Admit: 2022-12-25 | Discharge: 2022-12-25 | Disposition: A | Discharge status: Home / Self Care | Payer: Medicare HMO | Attending: Ophthalmology | Admitting: Ophthalmology

## 2022-12-25 ENCOUNTER — Ambulatory Visit: Payer: Medicare HMO | Admitting: Anesthesiology

## 2022-12-25 DIAGNOSIS — H2511 Age-related nuclear cataract, right eye: Secondary | ICD-10-CM | POA: Diagnosis not present

## 2022-12-25 DIAGNOSIS — I10 Essential (primary) hypertension: Secondary | ICD-10-CM | POA: Diagnosis not present

## 2022-12-25 DIAGNOSIS — M1990 Unspecified osteoarthritis, unspecified site: Secondary | ICD-10-CM | POA: Insufficient documentation

## 2022-12-25 DIAGNOSIS — E785 Hyperlipidemia, unspecified: Secondary | ICD-10-CM | POA: Diagnosis not present

## 2022-12-25 DIAGNOSIS — I2510 Atherosclerotic heart disease of native coronary artery without angina pectoris: Secondary | ICD-10-CM | POA: Insufficient documentation

## 2022-12-25 HISTORY — PX: CATARACT EXTRACTION W/PHACO: SHX586

## 2022-12-25 SURGERY — PHACOEMULSIFICATION, CATARACT, WITH IOL INSERTION
Anesthesia: Monitor Anesthesia Care | Laterality: Right

## 2022-12-25 MED ORDER — MIDAZOLAM HCL 2 MG/2ML IJ SOLN
INTRAMUSCULAR | Status: DC | PRN
  Administered 2022-12-25: 1 mg via INTRAVENOUS

## 2022-12-25 MED ORDER — TETRACAINE HCL 0.5 % OP SOLN
1.0000 [drp] | OPHTHALMIC | Status: AC | PRN
  Administered 2022-12-25 (×3): 1 [drp] via OPHTHALMIC

## 2022-12-25 MED ORDER — MOXIFLOXACIN HCL 0.5 % OP SOLN
OPHTHALMIC | Status: DC | PRN
  Administered 2022-12-25: .2 mL via OPHTHALMIC

## 2022-12-25 MED ORDER — LACTATED RINGERS IV SOLN: INTRAVENOUS | Status: DC

## 2022-12-25 MED ORDER — SIGHTPATH DOSE#1 NA HYALUR & NA CHOND-NA HYALUR IO KIT
PACK | INTRAOCULAR | Status: DC | PRN
  Administered 2022-12-25: 1 via OPHTHALMIC

## 2022-12-25 MED ORDER — LIDOCAINE HCL (PF) 2 % IJ SOLN
INTRAOCULAR | Status: DC | PRN
  Administered 2022-12-25: 4 mL via INTRAOCULAR

## 2022-12-25 MED ORDER — SIGHTPATH DOSE#1 BSS IO SOLN
INTRAOCULAR | Status: DC | PRN
  Administered 2022-12-25: 15 mL via INTRAOCULAR

## 2022-12-25 MED ORDER — ARMC OPHTHALMIC DILATING DROPS
1.0000 | OPHTHALMIC | Status: DC | PRN
  Administered 2022-12-25 (×3): 1 via OPHTHALMIC

## 2022-12-25 MED ORDER — BRIMONIDINE TARTRATE-TIMOLOL 0.2-0.5 % OP SOLN
OPHTHALMIC | Status: DC | PRN
  Administered 2022-12-25: 1 [drp] via OPHTHALMIC

## 2022-12-25 MED ORDER — SIGHTPATH DOSE#1 BSS IO SOLN
INTRAOCULAR | Status: DC | PRN
  Administered 2022-12-25: 80 mL via OPHTHALMIC

## 2022-12-25 MED ORDER — FENTANYL CITRATE (PF) 100 MCG/2ML IJ SOLN
INTRAMUSCULAR | Status: DC | PRN
  Administered 2022-12-25: 50 ug via INTRAVENOUS

## 2022-12-25 SURGICAL SUPPLY — 22 items
CANNULA ANT/CHMB 27G (MISCELLANEOUS) IMPLANT
CANNULA ANT/CHMB 27GA (MISCELLANEOUS) IMPLANT
CATARACT SUITE SIGHTPATH (MISCELLANEOUS) ×1 IMPLANT
DISSECTOR HYDRO NUCLEUS 50X22 (MISCELLANEOUS) ×1 IMPLANT
DRSG TEGADERM 2-3/8X2-3/4 SM (GAUZE/BANDAGES/DRESSINGS) ×1 IMPLANT
FEE CATARACT SUITE SIGHTPATH (MISCELLANEOUS) ×1 IMPLANT
GLOVE SURG SYN 7.5  E (GLOVE) ×1
GLOVE SURG SYN 7.5 E (GLOVE) ×1 IMPLANT
GLOVE SURG SYN 7.5 PF PI (GLOVE) ×1 IMPLANT
GLOVE SURG SYN 8.5  E (GLOVE) ×1
GLOVE SURG SYN 8.5 E (GLOVE) ×1 IMPLANT
GLOVE SURG SYN 8.5 PF PI (GLOVE) ×1 IMPLANT
LENS IOL TECNIS EYHANCE 21.0 (Intraocular Lens) IMPLANT
NDL FILTER BLUNT 18X1 1/2 (NEEDLE) IMPLANT
NEEDLE FILTER BLUNT 18X1 1/2 (NEEDLE) IMPLANT
PACK VIT ANT 23G (MISCELLANEOUS) IMPLANT
RING MALYGIN (MISCELLANEOUS) IMPLANT
SUT ETHILON 10-0 CS-B-6CS-B-6 (SUTURE)
SUTURE EHLN 10-0 CS-B-6CS-B-6 (SUTURE) IMPLANT
SYR 3ML LL SCALE MARK (SYRINGE) IMPLANT
SYR 5ML LL (SYRINGE) IMPLANT
WATER STERILE IRR 250ML POUR (IV SOLUTION) ×1 IMPLANT

## 2022-12-25 NOTE — Op Note (Signed)
OPERATIVE NOTE  [name] [id number] 12/25/2022   PREOPERATIVE DIAGNOSIS: Nuclear sclerotic cataract right eye. H25.11   POSTOPERATIVE DIAGNOSIS: Nuclear sclerotic cataract right eye. H25.11   PROCEDURE:  Phacoemusification with posterior chamber intraocular lens placement of the right eye  Ultrasound time: Procedure(s): CATARACT EXTRACTION PHACO AND INTRAOCULAR LENS PLACEMENT (IOC) RIGHT 7.16 00:49.0 (Right)  LENS:   Implant Name Type Inv. Item Serial No. Manufacturer Lot No. LRB No. Used Action  LENS IOL TECNIS EYHANCE 21.0 - [id number] Intraocular Lens LENS IOL TECNIS EYHANCE 21.0 [id number] [employer]  Right 1 Implanted      SURGEON:  Courtney Heys. Lazarus Salines, MD   ANESTHESIA:  Topical with tetracaine drops, augmented with 1% preservative-free intracameral lidocaine.   COMPLICATIONS:  None.   DESCRIPTION OF PROCEDURE:  The patient was identified in the holding room and transported to the operating room and placed in the supine position under the operating microscope.  The right eye was identified as the operative eye, which was prepped and draped in the usual sterile ophthalmic fashion.   A 1 millimeter clear-corneal paracentesis was made superotemporally. Preservative-free 1% lidocaine mixed with 1:1,000 bisulfite-free aqueous solution of epinephrine was injected into the anterior chamber. The anterior chamber was then filled with Viscoat viscoelastic. A 2.4 millimeter keratome was used to make a clear-corneal incision inferotemporally. A curvilinear capsulorrhexis was made with a cystotome and capsulorrhexis forceps. Balanced salt solution was used to hydrodissect and hydrodelineate the nucleus. Phacoemulsification was then used to remove the lens nucleus and epinucleus. The remaining cortex was then removed using the irrigation and aspiration handpiece. Provisc was then placed into the capsular bag to distend it for lens placement. A +21.00 D DIB00 intraocular lens was then injected  into the capsular bag. The remaining viscoelastic was aspirated.   Wounds were hydrated with balanced salt solution.  The anterior chamber was inflated to a physiologic pressure with balanced salt solution.  No wound leaks were noted. Vigamox was injected intracamerally.  Timolol and Brimonidine drops were applied to the eye.  The patient was taken to the recovery room in stable condition without complications of anesthesia or surgery.  [employer] 12/25/2022, 10:28 AM

## 2022-12-25 NOTE — Transfer of Care (Signed)
Immediate Anesthesia Transfer of Care Note  Patient: Tracy Nicholson  Procedure(s) Performed: CATARACT EXTRACTION PHACO AND INTRAOCULAR LENS PLACEMENT (IOC) RIGHT 7.16 00:49.0 (Right)  Patient Location: PACU  Anesthesia Type: MAC  Level of Consciousness: awake, alert  and patient cooperative  Airway and Oxygen Therapy: Patient Spontanous Breathing and Patient connected to supplemental oxygen  Post-op Assessment: Post-op Vital signs reviewed, Patient's Cardiovascular Status Stable, Respiratory Function Stable, Patent Airway and No signs of Nausea or vomiting  Post-op Vital Signs: Reviewed and stable  Complications: No notable events documented.

## 2022-12-25 NOTE — Anesthesia Postprocedure Evaluation (Signed)
Anesthesia Post Note  Patient: Tracy Nicholson  Procedure(s) Performed: CATARACT EXTRACTION PHACO AND INTRAOCULAR LENS PLACEMENT (IOC) RIGHT 7.16 00:49.0 (Right)  Patient location during evaluation: PACU Anesthesia Type: MAC Level of consciousness: awake and alert Pain management: pain level controlled Vital Signs Assessment: post-procedure vital signs reviewed and stable Respiratory status: spontaneous breathing, nonlabored ventilation, respiratory function stable and patient connected to nasal cannula oxygen Cardiovascular status: stable and blood pressure returned to baseline Postop Assessment: no apparent nausea or vomiting Anesthetic complications: no  No notable events documented.   Last Vitals:  Vitals:   12/25/22 1029 12/25/22 1035  BP: 136/64 135/82  Pulse: 80 78  Resp: 18 15  Temp: (!) 36.3 C (!) 36.3 C  SpO2: 100% 97%    Last Pain:  Vitals:   12/25/22 1029  PainSc: [place]

## 2022-12-25 NOTE — H&P (Signed)
[facility]   Primary Care Physician:  Sofie Hartigan, MD Ophthalmologist: Dr. Merleen Nicely  Pre-Procedure History & Physical: HPI:  Tracy Nicholson is a 77 y.o. female here for cataract surgery.   Past Medical History:  Diagnosis Date   Arthritis    Hyperlipemia    Hypertension     Past Surgical History:  Procedure Laterality Date   COLONOSCOPY      Prior to Admission medications   Medication Sig Start Date End Date Taking? Authorizing Provider  aspirin 81 MG tablet Take 81 mg by mouth daily.   Yes [provider]  losartan (COZAAR) 25 MG tablet Take 25 mg by mouth daily.   Yes [provider]  MAGNESIUM CITRATE PO Take by mouth daily.   Yes [provider]  Multiple Vitamin (MULTIVITAMIN) tablet Take 1 tablet by mouth daily.   Yes [provider]  Multiple Vitamins-Minerals (PRESERVISION AREDS 2 PO) Take by mouth daily.   Yes [provider]  Omega-3 Fatty Acids (FISH OIL) 1000 MG CAPS Take by mouth daily.   Yes [provider]  rosuvastatin (CRESTOR) 20 MG tablet Take 20 mg by mouth daily.   Yes [provider]  triamterene-hydrochlorothiazide (MAXZIDE-25) 37.5-25 MG tablet Take 1 tablet by mouth daily.   Yes [provider]  meloxicam (MOBIC) 7.5 MG tablet Take 1 tablet (7.5 mg total) by mouth daily. Patient not taking: Reported on 12/18/2022 06/26/21   Coral Spikes, DO  fluticasone [facility]) 50 MCG/ACT nasal spray Place 2 sprays into both nostrils daily. 12/01/15 04/11/20  Lorin Picket, PA-C    Allergies as of 11/20/2022   (No Known Allergies)    Family History  Problem Relation Age of Onset   Coronary artery disease Mother    Stroke Father     Social History   Socioeconomic History   Marital status: Married    Spouse name: Not on file   Number of children: Not on file   Years of education: Not on file   Highest education level: Not on file  Occupational History   Not on  file  Tobacco Use   Smoking status: Never   Smokeless tobacco: Never  Vaping Use   Vaping Use: Never used  Substance and Sexual Activity   Alcohol use: No   Drug use: No   Sexual activity: Not on file  Other Topics Concern   Not on file  Social History Narrative   Not on file   Social Determinants of Health   Financial Resource Strain: Not on file  Food Insecurity: Not on file  Transportation Needs: Not on file  Physical Activity: Not on file  Stress: Not on file  Social Connections: Not on file  Intimate Partner Violence: Not on file    Review of Systems: See HPI, otherwise negative ROS  Physical Exam: Ht 4\' 8"  (1.422 m)   Wt 58.1 kg   BMI 28.70 kg/m  General:   Alert, cooperative in NAD Head:  Normocephalic and atraumatic. Respiratory:  Normal work of breathing. Cardiovascular:  RRR  Impression/Plan: Tracy Nicholson is here for cataract surgery.  Risks, benefits, limitations, and alternatives regarding cataract surgery have been reviewed with the patient.  Questions have been answered.  All parties agreeable.   Norvel Richards, MD  12/25/2022, 7:07 AM

## 2022-12-25 NOTE — Anesthesia Preprocedure Evaluation (Signed)
Anesthesia Evaluation  Patient identified by MRN, date of birth, ID band Patient awake    Reviewed: Allergy & Precautions, NPO status , Patient's Chart, lab work & pertinent test results  Airway Mallampati: II  TM Distance: >3 FB Neck ROM: full    Dental  (+) Chipped, Dental Advidsory Given   Pulmonary neg pulmonary ROS, neg shortness of breath, neg COPD   Pulmonary exam normal        Cardiovascular hypertension, On Medications + CAD  Normal cardiovascular exam     Neuro/Psych negative neurological ROS  negative psych ROS   GI/Hepatic negative GI ROS, Neg liver ROS,,,  Endo/Other  negative endocrine ROS    Renal/GU      Musculoskeletal   Abdominal   Peds  Hematology negative hematology ROS (+)   Anesthesia Other Findings Past Medical History: No date: Arthritis No date: Hyperlipemia No date: Hypertension  Past Surgical History: No date: COLONOSCOPY  BMI    Body Mass Index: 27.58 kg/m      Reproductive/Obstetrics negative OB ROS                              Anesthesia Physical Anesthesia Plan  ASA: 2  Anesthesia Plan: MAC   Post-op Pain Management:    Induction: Intravenous  PONV Risk Score and Plan: 2 and TIVA and Midazolam  Airway Management Planned: Natural Airway and Nasal Cannula  Additional Equipment:   Intra-op Plan:   Post-operative Plan:   Informed Consent: I have reviewed the patients History and Physical, chart, labs and discussed the procedure including the risks, benefits and alternatives for the proposed anesthesia with the patient or authorized representative who has indicated his/her understanding and acceptance.     Dental Advisory Given  Plan Discussed with: Anesthesiologist, CRNA and Surgeon  Anesthesia Plan Comments: (Patient consented for risks of anesthesia including but not limited to:  - adverse reactions to medications - damage to  eyes, teeth, lips or other oral mucosa - nerve damage due to positioning  - sore throat or hoarseness - Damage to heart, brain, nerves, lungs, other parts of body or loss of life  Patient voiced understanding.)        Anesthesia Quick Evaluation  

## 2022-12-26 ENCOUNTER — Encounter: Payer: Self-pay | Admitting: Ophthalmology

## 2022-12-26 ENCOUNTER — Other Ambulatory Visit: Payer: Self-pay

## 2023-01-03 DIAGNOSIS — H2512 Age-related nuclear cataract, left eye: Secondary | ICD-10-CM | POA: Diagnosis not present

## 2023-01-09 NOTE — Discharge Instructions (Signed)

## 2023-01-10 ENCOUNTER — Ambulatory Visit: Payer: Medicare HMO | Admitting: Anesthesiology

## 2023-01-10 ENCOUNTER — Other Ambulatory Visit: Payer: Self-pay

## 2023-01-10 ENCOUNTER — Encounter
Admission: RE | Disposition: A | Discharge status: Home / Self Care | Payer: Self-pay | Source: Home / Self Care | Attending: Ophthalmology

## 2023-01-10 ENCOUNTER — Ambulatory Visit
Admission: RE | Admit: 2023-01-10 | Discharge: 2023-01-10 | Disposition: A | Discharge status: Home / Self Care | Payer: Medicare HMO | Attending: Ophthalmology | Admitting: Ophthalmology

## 2023-01-10 ENCOUNTER — Encounter: Payer: Self-pay | Admitting: Ophthalmology

## 2023-01-10 DIAGNOSIS — H2512 Age-related nuclear cataract, left eye: Secondary | ICD-10-CM | POA: Insufficient documentation

## 2023-01-10 DIAGNOSIS — I2510 Atherosclerotic heart disease of native coronary artery without angina pectoris: Secondary | ICD-10-CM | POA: Diagnosis not present

## 2023-01-10 DIAGNOSIS — I10 Essential (primary) hypertension: Secondary | ICD-10-CM | POA: Diagnosis not present

## 2023-01-10 DIAGNOSIS — E785 Hyperlipidemia, unspecified: Secondary | ICD-10-CM | POA: Diagnosis not present

## 2023-01-10 HISTORY — PX: CATARACT EXTRACTION W/PHACO: SHX586

## 2023-01-10 SURGERY — PHACOEMULSIFICATION, CATARACT, WITH IOL INSERTION
Anesthesia: Monitor Anesthesia Care | Site: Eye | Laterality: Left

## 2023-01-10 MED ORDER — MOXIFLOXACIN HCL 0.5 % OP SOLN
OPHTHALMIC | Status: DC | PRN
  Administered 2023-01-10: .2 mL via OPHTHALMIC

## 2023-01-10 MED ORDER — ARMC OPHTHALMIC DILATING DROPS
1.0000 | OPHTHALMIC | Status: DC | PRN
  Administered 2023-01-10 (×2): 1 via OPHTHALMIC

## 2023-01-10 MED ORDER — TETRACAINE HCL 0.5 % OP SOLN
1.0000 [drp] | OPHTHALMIC | Status: DC | PRN
  Administered 2023-01-10 (×3): 1 [drp] via OPHTHALMIC

## 2023-01-10 MED ORDER — FENTANYL CITRATE (PF) 100 MCG/2ML IJ SOLN
INTRAMUSCULAR | Status: DC | PRN
  Administered 2023-01-10: 50 ug via INTRAVENOUS

## 2023-01-10 MED ORDER — LIDOCAINE HCL (PF) 2 % IJ SOLN
INTRAOCULAR | Status: DC | PRN
  Administered 2023-01-10: 1 mL via INTRAOCULAR

## 2023-01-10 MED ORDER — MIDAZOLAM HCL 2 MG/2ML IJ SOLN
INTRAMUSCULAR | Status: DC | PRN
  Administered 2023-01-10: 1 mg via INTRAVENOUS

## 2023-01-10 MED ORDER — BRIMONIDINE TARTRATE-TIMOLOL 0.2-0.5 % OP SOLN
OPHTHALMIC | Status: DC | PRN
  Administered 2023-01-10: 1 [drp] via OPHTHALMIC

## 2023-01-10 MED ORDER — LACTATED RINGERS IV SOLN: INTRAVENOUS | Status: DC

## 2023-01-10 MED ORDER — SIGHTPATH DOSE#1 BSS IO SOLN
INTRAOCULAR | Status: DC | PRN
  Administered 2023-01-10: 15 mL

## 2023-01-10 MED ORDER — SIGHTPATH DOSE#1 BSS IO SOLN
INTRAOCULAR | Status: DC | PRN
  Administered 2023-01-10: 94 mL via OPHTHALMIC

## 2023-01-10 MED ORDER — SIGHTPATH DOSE#1 NA HYALUR & NA CHOND-NA HYALUR IO KIT
PACK | INTRAOCULAR | Status: DC | PRN
  Administered 2023-01-10: 1 via OPHTHALMIC

## 2023-01-10 SURGICAL SUPPLY — 12 items
CATARACT SUITE SIGHTPATH (MISCELLANEOUS) ×1 IMPLANT
DISSECTOR HYDRO NUCLEUS 50X22 (MISCELLANEOUS) ×1 IMPLANT
DRSG TEGADERM 2-3/8X2-3/4 SM (GAUZE/BANDAGES/DRESSINGS) ×1 IMPLANT
FEE CATARACT SUITE SIGHTPATH (MISCELLANEOUS) ×1 IMPLANT
GLOVE SURG SYN 7.5  E (GLOVE) ×1
GLOVE SURG SYN 7.5 E (GLOVE) ×1 IMPLANT
GLOVE SURG SYN 7.5 PF PI (GLOVE) ×1 IMPLANT
GLOVE SURG SYN 8.5  E (GLOVE) ×1
GLOVE SURG SYN 8.5 E (GLOVE) ×1 IMPLANT
GLOVE SURG SYN 8.5 PF PI (GLOVE) ×1 IMPLANT
LENS IOL TECNIS EYHANCE 21.0 (Intraocular Lens) IMPLANT
WATER STERILE IRR 250ML POUR (IV SOLUTION) ×1 IMPLANT

## 2023-01-10 NOTE — Transfer of Care (Signed)
Immediate Anesthesia Transfer of Care Note  Patient: Tracy Nicholson  Procedure(s) Performed: CATARACT EXTRACTION PHACO AND INTRAOCULAR LENS PLACEMENT (IOC) LEFT  7.85  00:54.5 (Left: Eye)  Patient Location: PACU  Anesthesia Type: MAC  Level of Consciousness: awake, alert  and patient cooperative  Airway and Oxygen Therapy: Patient Spontanous Breathing and Patient connected to supplemental oxygen  Post-op Assessment: Post-op Vital signs reviewed, Patient's Cardiovascular Status Stable, Respiratory Function Stable, Patent Airway and No signs of Nausea or vomiting  Post-op Vital Signs: Reviewed and stable  Complications: No notable events documented.

## 2023-01-10 NOTE — Anesthesia Preprocedure Evaluation (Signed)
Anesthesia Evaluation  Patient identified by MRN, date of birth, ID band Patient awake    Reviewed: Allergy & Precautions, NPO status , Patient's Chart, lab work & pertinent test results  Airway Mallampati: II  TM Distance: >3 FB Neck ROM: full    Dental  (+) Chipped, Dental Advidsory Given   Pulmonary neg pulmonary ROS, neg shortness of breath, neg COPD   Pulmonary exam normal        Cardiovascular hypertension, On Medications + CAD  Normal cardiovascular exam     Neuro/Psych negative neurological ROS  negative psych ROS   GI/Hepatic negative GI ROS, Neg liver ROS,,,  Endo/Other  negative endocrine ROS    Renal/GU      Musculoskeletal   Abdominal   Peds  Hematology negative hematology ROS (+)   Anesthesia Other Findings Past Medical History: No date: Arthritis No date: Hyperlipemia No date: Hypertension  Past Surgical History: No date: COLONOSCOPY  BMI    Body Mass Index: 27.58 kg/m      Reproductive/Obstetrics negative OB ROS                              Anesthesia Physical Anesthesia Plan  ASA: 2  Anesthesia Plan: MAC   Post-op Pain Management:    Induction: Intravenous  PONV Risk Score and Plan: 2 and TIVA and Midazolam  Airway Management Planned: Natural Airway and Nasal Cannula  Additional Equipment:   Intra-op Plan:   Post-operative Plan:   Informed Consent: I have reviewed the patients History and Physical, chart, labs and discussed the procedure including the risks, benefits and alternatives for the proposed anesthesia with the patient or authorized representative who has indicated his/her understanding and acceptance.     Dental Advisory Given  Plan Discussed with: Anesthesiologist, CRNA and Surgeon  Anesthesia Plan Comments: (Patient consented for risks of anesthesia including but not limited to:  - adverse reactions to medications - damage to  eyes, teeth, lips or other oral mucosa - nerve damage due to positioning  - sore throat or hoarseness - Damage to heart, brain, nerves, lungs, other parts of body or loss of life  Patient voiced understanding.)        Anesthesia Quick Evaluation

## 2023-01-10 NOTE — H&P (Signed)
[facility]   Primary Care Physician:  Sofie Hartigan, MD Ophthalmologist: Dr. Merleen Nicely  Pre-Procedure History & Physical: HPI:  Tracy Nicholson is a 77 y.o. female here for cataract surgery.   Past Medical History:  Diagnosis Date   Arthritis    Hyperlipemia    Hypertension     Past Surgical History:  Procedure Laterality Date   CATARACT EXTRACTION W/PHACO Right 12/25/2022   Procedure: CATARACT EXTRACTION PHACO AND INTRAOCULAR LENS PLACEMENT (IOC) RIGHT 7.16 00:49.0;  Surgeon: Norvel Richards, MD;  Location: [place];  Service: Ophthalmology;  Laterality: Right;   COLONOSCOPY      Prior to Admission medications   Medication Sig Start Date End Date Taking? Authorizing Provider  aspirin 81 MG tablet Take 81 mg by mouth daily.    [provider]  losartan (COZAAR) 25 MG tablet Take 25 mg by mouth daily.    [provider]  MAGNESIUM CITRATE PO Take by mouth daily.    [provider]  meloxicam (MOBIC) 7.5 MG tablet Take 1 tablet (7.5 mg total) by mouth daily. Patient not taking: Reported on 12/18/2022 06/26/21   Coral Spikes, DO  Multiple Vitamin (MULTIVITAMIN) tablet Take 1 tablet by mouth daily.    [provider]  Multiple Vitamins-Minerals (PRESERVISION AREDS 2 PO) Take by mouth daily.    [provider]  Omega-3 Fatty Acids (FISH OIL) 1000 MG CAPS Take by mouth daily.    [provider]  rosuvastatin (CRESTOR) 20 MG tablet Take 20 mg by mouth daily.    [provider]  triamterene-hydrochlorothiazide (MAXZIDE-25) 37.5-25 MG tablet Take 1 tablet by mouth daily.    [provider]  fluticasone (FLONASE) 50 MCG/ACT nasal spray Place 2 sprays into both nostrils daily. 12/01/15 04/11/20  Lorin Picket, PA-C    Allergies as of 11/20/2022   (No Known Allergies)    Family History  Problem Relation Age of Onset   Coronary artery disease Mother    Stroke Father      Social History   Socioeconomic History   Marital status: Married    Spouse name: Not on file   Number of children: Not on file   Years of education: Not on file   Highest education level: Not on file  Occupational History   Not on file  Tobacco Use   Smoking status: Never   Smokeless tobacco: Never  Vaping Use   Vaping Use: Never used  Substance and Sexual Activity   Alcohol use: No   Drug use: No   Sexual activity: Not on file  Other Topics Concern   Not on file  Social History Narrative   Not on file   Social Determinants of Health   Financial Resource Strain: Not on file  Food Insecurity: Not on file  Transportation Needs: Not on file  Physical Activity: Not on file  Stress: Not on file  Social Connections: Not on file  Intimate Partner Violence: Not on file    Review of Systems: See HPI, otherwise negative ROS  Physical Exam: There were no vitals taken for this visit. General:   Alert, cooperative in NAD Head:  Normocephalic and atraumatic. Respiratory:  Normal work of breathing. Cardiovascular:  RRR  Impression/Plan: Tracy Nicholson is here for cataract surgery.  Risks, benefits, limitations, and alternatives regarding cataract surgery have been reviewed with the patient.  Questions have been answered.  All parties agreeable.   Norvel Richards, MD  01/10/2023, 11:36 AM

## 2023-01-10 NOTE — Op Note (Signed)
OPERATIVE NOTE  Tracy Nicholson [id number] 01/10/2023   PREOPERATIVE DIAGNOSIS: Nuclear sclerotic cataract left eye. H25.12   POSTOPERATIVE DIAGNOSIS: Nuclear sclerotic cataract left eye. H25.12   PROCEDURE:  Phacoemusification with posterior chamber intraocular lens placement of the left eye  Ultrasound time: Procedure(s): CATARACT EXTRACTION PHACO AND INTRAOCULAR LENS PLACEMENT (IOC) LEFT  7.85  00:54.5 (Left)  LENS:   Implant Name Type Inv. Item Serial No. Manufacturer Lot No. LRB No. Used Action  LENS IOL TECNIS EYHANCE 21.0 - [id number] Intraocular Lens LENS IOL TECNIS EYHANCE 21.0 [id number] [employer]  Left 1 Implanted      SURGEON:  Courtney Heys. Lazarus Salines, MD   ANESTHESIA:  Topical with tetracaine drops, augmented with 1% preservative-free intracameral lidocaine.   COMPLICATIONS:  None.   DESCRIPTION OF PROCEDURE:  The patient was identified in the holding room and transported to the operating room and placed in the supine position under the operating microscope.  The left eye was identified as the operative eye, which was prepped and draped in the usual sterile ophthalmic fashion.   A 1 millimeter clear-corneal paracentesis was made inferotemporally. Preservative-free 1% lidocaine mixed with 1:1,000 bisulfite-free aqueous solution of epinephrine was injected into the anterior chamber. The anterior chamber was then filled with Viscoat viscoelastic. A 2.4 millimeter keratome was used to make a clear-corneal incision superotemporally. A curvilinear capsulorrhexis was made with a cystotome and capsulorrhexis forceps. Balanced salt solution was used to hydrodissect and hydrodelineate the nucleus. Phacoemulsification was then used to remove the lens nucleus and epinucleus. The remaining cortex was then removed using the irrigation and aspiration handpiece. Provisc was then placed into the capsular bag to distend it for lens placement. A +21.00 D DIB00 intraocular lens was then injected into  the capsular bag. The remaining viscoelastic was aspirated.   Wounds were hydrated with balanced salt solution.  The anterior chamber was inflated to a physiologic pressure with balanced salt solution.  No wound leaks were noted. Vigamox was injected intracamerally.  Timolol and Brimonidine drops were applied to the eye.  The patient was taken to the recovery room in stable condition without complications of anesthesia or surgery.  [name] [place] 01/10/2023, 1:28 PM

## 2023-01-11 ENCOUNTER — Encounter: Payer: Self-pay | Admitting: Ophthalmology

## 2023-01-11 NOTE — Anesthesia Postprocedure Evaluation (Signed)
Anesthesia Post Note  Patient: Tracy Nicholson  Procedure(s) Performed: CATARACT EXTRACTION PHACO AND INTRAOCULAR LENS PLACEMENT (IOC) LEFT  7.85  00:54.5 (Left: Eye)  Patient location during evaluation: PACU Anesthesia Type: MAC Level of consciousness: awake and alert Pain management: pain level controlled Vital Signs Assessment: post-procedure vital signs reviewed and stable Respiratory status: spontaneous breathing, nonlabored ventilation, respiratory function stable and patient connected to nasal cannula oxygen Cardiovascular status: stable and blood pressure returned to baseline Postop Assessment: no apparent nausea or vomiting Anesthetic complications: no  No notable events documented.   Last Vitals:  Vitals:   01/10/23 1331 01/10/23 1332  BP:  (!) 145/73  Pulse: 76 75  Resp: 12 13  Temp:    SpO2: 99% 98%    Last Pain:  Vitals:   01/10/23 1332  TempSrc:   PainSc: 0-No pain                 Dimas Millin

## 2023-01-28 DIAGNOSIS — Z0100 Encounter for examination of eyes and vision without abnormal findings: Secondary | ICD-10-CM | POA: Diagnosis not present

## 2023-06-11 DIAGNOSIS — Z0000 Encounter for general adult medical examination without abnormal findings: Secondary | ICD-10-CM | POA: Diagnosis not present

## 2023-06-11 DIAGNOSIS — K589 Irritable bowel syndrome without diarrhea: Secondary | ICD-10-CM | POA: Diagnosis not present

## 2023-06-11 DIAGNOSIS — E782 Mixed hyperlipidemia: Secondary | ICD-10-CM | POA: Diagnosis not present

## 2023-06-11 DIAGNOSIS — M129 Arthropathy, unspecified: Secondary | ICD-10-CM | POA: Diagnosis not present

## 2023-06-11 DIAGNOSIS — Z1331 Encounter for screening for depression: Secondary | ICD-10-CM | POA: Diagnosis not present

## 2023-06-11 DIAGNOSIS — I10 Essential (primary) hypertension: Secondary | ICD-10-CM | POA: Diagnosis not present

## 2023-06-11 DIAGNOSIS — R7302 Impaired glucose tolerance (oral): Secondary | ICD-10-CM | POA: Diagnosis not present

## 2023-07-31 ENCOUNTER — Ambulatory Visit
Admission: EM | Admit: 2023-07-31 | Discharge: 2023-07-31 | Disposition: A | Discharge status: Home / Self Care | Payer: Medicare HMO | Attending: Emergency Medicine | Admitting: Emergency Medicine

## 2023-07-31 DIAGNOSIS — K589 Irritable bowel syndrome without diarrhea: Secondary | ICD-10-CM | POA: Insufficient documentation

## 2023-07-31 DIAGNOSIS — Z1152 Encounter for screening for COVID-19: Secondary | ICD-10-CM | POA: Diagnosis not present

## 2023-07-31 DIAGNOSIS — Z8719 Personal history of other diseases of the digestive system: Secondary | ICD-10-CM | POA: Diagnosis not present

## 2023-07-31 DIAGNOSIS — B349 Viral infection, unspecified: Secondary | ICD-10-CM | POA: Insufficient documentation

## 2023-07-31 LAB — URINALYSIS, W/ REFLEX TO CULTURE (INFECTION SUSPECTED)
Bacteria, UA: NONE SEEN
Bilirubin Urine: NEGATIVE
Glucose, UA: NEGATIVE mg/dL
Hgb urine dipstick: NEGATIVE
Ketones, ur: NEGATIVE mg/dL
Leukocytes,Ua: NEGATIVE
Nitrite: NEGATIVE
Protein, ur: NEGATIVE mg/dL
Specific Gravity, Urine: 1.01 (ref 1.005–1.030)
WBC, UA: NONE SEEN WBC/hpf (ref 0–5)
pH: 6.5 (ref 5.0–8.0)

## 2023-07-31 LAB — SARS CORONAVIRUS 2 BY RT PCR: SARS Coronavirus 2 by RT PCR: NEGATIVE

## 2023-07-31 NOTE — ED Provider Notes (Signed)
[facility]    CSN: [id number] Arrival date & time: 07/31/23  0802      History   Chief Complaint No chief complaint on file.   HPI Tracy Nicholson is a 77 y.o. female.   77 year old female pt, Tracy Nicholson, presents to urgent care for evaluation of headache, lower abdominal pain, nausea x 3 days. Pt complaining of lower abdominal pressure, no known illness exposure, PMH of IBS.  The history is provided by the patient. No language interpreter was used.    Past Medical History:  Diagnosis Date   Arthritis    Hyperlipemia    Hypertension     Patient Active Problem List   Diagnosis Date Noted   Nonspecific syndrome suggestive of viral illness 07/31/2023   History of IBS 07/31/2023    Past Surgical History:  Procedure Laterality Date   CATARACT EXTRACTION W/PHACO Right 12/25/2022   Procedure: CATARACT EXTRACTION PHACO AND INTRAOCULAR LENS PLACEMENT (IOC) RIGHT 7.16 00:49.0;  Surgeon: Estanislado Pandy, MD;  Location: [facility];  Service: Ophthalmology;  Laterality: Right;   CATARACT EXTRACTION W/PHACO Left 01/10/2023   Procedure: CATARACT EXTRACTION PHACO AND INTRAOCULAR LENS PLACEMENT (IOC) LEFT  7.85  00:54.5;  Surgeon: Estanislado Pandy, MD;  Location: [facility];  Service: Ophthalmology;  Laterality: Left;   COLONOSCOPY      OB History   No obstetric history on file.      Home Medications    Prior to Admission medications   Medication Sig Start Date End Date Taking? Authorizing Provider  aspirin 81 MG tablet Take 81 mg by mouth daily.   Yes [provider]  losartan (COZAAR) 25 MG tablet Take 25 mg by mouth daily.   Yes [provider]  MAGNESIUM CITRATE PO Take by mouth daily.   Yes [provider]  Multiple Vitamin (MULTIVITAMIN) tablet Take 1 tablet by mouth daily.   Yes [provider]  Multiple Vitamins-Minerals (PRESERVISION AREDS 2 PO) Take by mouth daily.   Yes [provider]  Omega-3 Fatty Acids (FISH OIL) 1000 MG CAPS Take by mouth daily.   Yes [provider]  rosuvastatin (CRESTOR) 20 MG tablet Take 20 mg by mouth daily.   Yes [provider]  triamterene-hydrochlorothiazide (MAXZIDE-25) 37.5-25 MG tablet Take 1 tablet by mouth daily.   Yes [provider]  meloxicam (MOBIC) 7.5 MG tablet Take 1 tablet (7.5 mg total) by mouth daily. Patient not taking: Reported on 12/18/2022 06/26/21   Tommie Sams, DO  fluticasone [facility]) 50 MCG/ACT nasal spray Place 2 sprays into both nostrils daily. 12/01/15 04/11/20  Lutricia Feil, PA-C    Family History Family History  Problem Relation Age of Onset   Coronary artery disease Mother    Stroke Father     Social History Social History   Tobacco Use   Smoking status: Never   Smokeless tobacco: Never  Vaping Use   Vaping status: Never Used  Substance Use Topics   Alcohol use: No   Drug use: No     Allergies   Patient has no known allergies.   Review of Systems Review of Systems  Constitutional:  Negative for fever.  Gastrointestinal:  Positive for abdominal pain and nausea.  Genitourinary:  Negative for dysuria.  Neurological:  Positive for headaches.  All other systems reviewed and are negative.    Physical Exam Triage Vital Signs ED Triage Vitals  Encounter Vitals Group  BP      Systolic BP Percentile      Diastolic BP Percentile      Pulse      Resp      Temp      Temp src      SpO2      Weight      Height      Head Circumference      Peak Flow      Pain Score      Pain Loc      Pain Education      Exclude from Growth Chart    No data found.  Updated Vital Signs BP 135/82 (BP Location: Left Arm)   Pulse 92   Temp 98.4 F (36.9 C) (Oral)   Resp 18   Wt 128 lb (58.1 kg)   SpO2 94%   BMI 28.70 kg/m   Visual Acuity Right Eye Distance:   Left Eye Distance:   Bilateral Distance:    Right Eye Near:   Left Eye Near:     Bilateral Near:     Physical Exam Vitals and nursing note reviewed.  Constitutional:      Appearance: Normal appearance. She is well-developed and well-groomed.  HENT:     Head: Normocephalic.     Right Ear: Tympanic membrane is retracted.     Left Ear: Tympanic membrane is retracted.     Nose: Congestion present.     Mouth/Throat:     Lips: Pink.     Mouth: Mucous membranes are moist.     Pharynx: Oropharynx is clear.  Cardiovascular:     Rate and Rhythm: Normal rate and regular rhythm.     Pulses: Normal pulses.     Heart sounds: Normal heart sounds.  Pulmonary:     Effort: Pulmonary effort is normal.     Breath sounds: Normal breath sounds and air entry.  Abdominal:     General: Bowel sounds are normal.     Palpations: Abdomen is soft.     Tenderness: There is abdominal tenderness in the suprapubic area.  Neurological:     General: No focal deficit present.     Mental Status: She is alert and oriented to person, place, and time.     GCS: GCS eye subscore is 4. GCS verbal subscore is 5. GCS motor subscore is 6.  Psychiatric:        Attention and Perception: Attention normal.        Mood and Affect: Mood normal.        Speech: Speech normal.        Behavior: Behavior normal. Behavior is cooperative.      UC Treatments / Results  Labs (all labs ordered are listed, but only abnormal results are displayed) Labs Reviewed  SARS CORONAVIRUS 2 BY RT PCR  URINALYSIS, W/ REFLEX TO CULTURE (INFECTION SUSPECTED)    EKG   Radiology No results found.  Procedures Procedures (including critical care time)  Medications Ordered in UC Medications - No data to display  Initial Impression / Assessment and Plan / UC Course  I have reviewed the triage vital signs and the nursing notes.  Pertinent labs & imaging results that were available during my care of the patient were reviewed by me and considered in my medical decision making (see chart for details).  Clinical  Course as of 07/31/23 0921  Wed Jul 31, 2023  1610 Urine is negative for UTI, Covid pending. [JD]  Clinical Course User Index [JD] Harbour Nordmeyer, Para March, NP   Discussed exam findings and plan of care with patient, strict go to ER precautions given.   Patient verbalized understanding to this provider.  Ddx: Viral illness, IBS, allergies Final Clinical Impressions(s) / UC Diagnoses   Final diagnoses:  Nonspecific syndrome suggestive of viral illness  History of IBS     Discharge Instructions      Your urine test and covid test are both negative. Rest,push fluids, follow up with PCP. If symptoms worsen go to ER for further evaluation.      ED Prescriptions   None    PDMP not reviewed this encounter.   Clancy Gourd, NP 07/31/23 [phone]

## 2023-07-31 NOTE — ED Triage Notes (Signed)
Sx started Sunday  Felt feverish,loss of appetite, headache, lower abdominal pain,nausea.

## 2023-07-31 NOTE — Discharge Instructions (Addendum)
Your urine test and covid test are both negative. Rest,push fluids, follow up with PCP. If symptoms worsen go to ER for further evaluation.

## 2023-08-01 ENCOUNTER — Emergency Department
Admission: EM | Admit: 2023-08-01 | Discharge: 2023-08-01 | Disposition: A | Discharge status: Home / Self Care | Payer: Medicare HMO | Attending: Emergency Medicine | Admitting: Emergency Medicine

## 2023-08-01 ENCOUNTER — Emergency Department: Payer: Medicare HMO

## 2023-08-01 ENCOUNTER — Other Ambulatory Visit: Payer: Self-pay

## 2023-08-01 DIAGNOSIS — N83202 Unspecified ovarian cyst, left side: Secondary | ICD-10-CM | POA: Diagnosis not present

## 2023-08-01 DIAGNOSIS — R102 Pelvic and perineal pain: Secondary | ICD-10-CM | POA: Diagnosis not present

## 2023-08-01 DIAGNOSIS — K8020 Calculus of gallbladder without cholecystitis without obstruction: Secondary | ICD-10-CM | POA: Diagnosis not present

## 2023-08-01 DIAGNOSIS — Z20822 Contact with and (suspected) exposure to covid-19: Secondary | ICD-10-CM | POA: Diagnosis not present

## 2023-08-01 DIAGNOSIS — I10 Essential (primary) hypertension: Secondary | ICD-10-CM | POA: Insufficient documentation

## 2023-08-01 DIAGNOSIS — K5730 Diverticulosis of large intestine without perforation or abscess without bleeding: Secondary | ICD-10-CM | POA: Diagnosis not present

## 2023-08-01 DIAGNOSIS — R1030 Lower abdominal pain, unspecified: Secondary | ICD-10-CM | POA: Diagnosis present

## 2023-08-01 DIAGNOSIS — R109 Unspecified abdominal pain: Secondary | ICD-10-CM | POA: Diagnosis not present

## 2023-08-01 LAB — COMPREHENSIVE METABOLIC PANEL
ALT: 17 U/L (ref 0–44)
AST: 31 U/L (ref 15–41)
Albumin: 4.3 g/dL (ref 3.5–5.0)
Alkaline Phosphatase: 72 U/L (ref 38–126)
Anion gap: 15 (ref 5–15)
BUN: 20 mg/dL (ref 8–23)
CO2: 22 mmol/L (ref 22–32)
Calcium: 9.7 mg/dL (ref 8.9–10.3)
Chloride: 96 mmol/L — ABNORMAL LOW (ref 98–111)
Creatinine, Ser: 1.08 mg/dL — ABNORMAL HIGH (ref 0.44–1.00)
GFR, Estimated: 53 mL/min — ABNORMAL LOW (ref >60)
Glucose, Bld: 111 mg/dL — ABNORMAL HIGH (ref 70–99)
Potassium: 3.4 mmol/L — ABNORMAL LOW (ref 3.5–5.1)
Sodium: 133 mmol/L — ABNORMAL LOW (ref 135–145)
Total Bilirubin: 1.3 mg/dL — ABNORMAL HIGH (ref 0.3–1.2)
Total Protein: 8.2 g/dL — ABNORMAL HIGH (ref 6.5–8.1)

## 2023-08-01 LAB — CBC
HCT: 43.1 % (ref 36.0–46.0)
Hemoglobin: 13.9 g/dL (ref 12.0–15.0)
MCH: 27.5 pg (ref 26.0–34.0)
MCHC: 32.3 g/dL (ref 30.0–36.0)
MCV: 85.2 fL (ref 80.0–100.0)
Platelets: 388 10*3/uL (ref 150–400)
RBC: 5.06 MIL/uL (ref 3.87–5.11)
RDW: 13.2 % (ref 11.5–15.5)
WBC: 8.9 10*3/uL (ref 4.0–10.5)
nRBC: 0 % (ref 0.0–0.2)

## 2023-08-01 LAB — LIPASE, BLOOD: Lipase: 47 U/L (ref 11–51)

## 2023-08-01 LAB — URINALYSIS, ROUTINE W REFLEX MICROSCOPIC
Bacteria, UA: NONE SEEN
Bilirubin Urine: NEGATIVE
Glucose, UA: NEGATIVE mg/dL
Hgb urine dipstick: NEGATIVE
Ketones, ur: NEGATIVE mg/dL
Nitrite: NEGATIVE
Protein, ur: NEGATIVE mg/dL
RBC / HPF: 0 RBC/hpf (ref 0–5)
Specific Gravity, Urine: 1.008 (ref 1.005–1.030)
Squamous Epithelial / HPF: 0 /HPF (ref 0–5)
pH: 6 (ref 5.0–8.0)

## 2023-08-01 LAB — SARS CORONAVIRUS 2 BY RT PCR: SARS Coronavirus 2 by RT PCR: NEGATIVE

## 2023-08-01 MED ORDER — IOHEXOL 300 MG/ML  SOLN
100.0000 mL | Freq: Once | INTRAMUSCULAR | Status: AC | PRN
  Administered 2023-08-01: 100 mL via INTRAVENOUS

## 2023-08-01 MED ORDER — ONDANSETRON HCL 4 MG/2ML IJ SOLN
4.0000 mg | Freq: Once | INTRAMUSCULAR | Status: AC
  Administered 2023-08-01: 4 mg via INTRAVENOUS
  Filled 2023-08-01: qty 2

## 2023-08-01 MED ORDER — SODIUM CHLORIDE 0.9 % IV BOLUS
1000.0000 mL | Freq: Once | INTRAVENOUS | Status: AC
  Administered 2023-08-01: 1000 mL via INTRAVENOUS

## 2023-08-01 MED ORDER — TRAMADOL HCL 50 MG PO TABS
50.0000 mg | ORAL_TABLET | Freq: Four times a day (QID) | ORAL | 0 refills | Status: AC | PRN
Start: 2023-08-01 — End: ?

## 2023-08-01 MED ORDER — FENTANYL CITRATE PF 50 MCG/ML IJ SOSY
50.0000 ug | PREFILLED_SYRINGE | Freq: Once | INTRAMUSCULAR | Status: AC
  Administered 2023-08-01: 50 ug via INTRAVENOUS
  Filled 2023-08-01: qty 1

## 2023-08-01 NOTE — ED Provider Notes (Signed)
[facility] Provider Note    Event Date/Time   First MD Initiated Contact with Patient 08/01/23 1228     (approximate)  History   Chief Complaint: Abdominal Pain  HPI  FLODA GONALEZ is a 77 y.o. female with a past medical history of hypertension, hyperlipidemia, arthritis, presents to the emergency department for lower abdominal pain.  According to the patient for many months she has been experiencing lower abdominal pain which she has contributed to her IBS.  She however states over the past 1 week she has had more significant and more constant pain to the lower abdomen.  Patient states occasional loose stool but states that is baseline for her.  Patient denies any urinary symptoms.  No fever.  Denies any cough or congestion.  Denies any vaginal bleeding.  Physical Exam   Triage Vital Signs: ED Triage Vitals  Encounter Vitals Group     BP 08/01/23 1145 (!) 150/83     Systolic BP Percentile --      Diastolic BP Percentile --      Pulse Rate 08/01/23 1145 94     Resp 08/01/23 1145 16     Temp 08/01/23 1145 98 F (36.7 C)     Temp Source 08/01/23 1145 Oral     SpO2 08/01/23 1145 94 %     Weight 08/01/23 1146 128 lb (58.1 kg)     Height 08/01/23 1146 4\' 8"  (1.422 m)     Head Circumference --      Peak Flow --      Pain Score 08/01/23 1145 10     Pain Loc --      Pain Education --      Exclude from Growth Chart --     Most recent vital signs: Vitals:   08/01/23 1145  BP: (!) 150/83  Pulse: 94  Resp: 16  Temp: 98 F (36.7 C)  SpO2: 94%    General: Awake, no distress.  CV:  Good peripheral perfusion.  Regular rate and rhythm  Resp:  Normal effort.  Equal breath sounds bilaterally.  Abd:  No distention.  Soft, minimal suprapubic tenderness otherwise benign abdomen.  No rebound or guarding.   ED Results / Procedures / Treatments   RADIOLOGY  I have reviewed and interpreted the CT images.  I do not see any significant abnormality in  the lower abdomen.    MEDICATIONS ORDERED IN ED: Medications  sodium chloride 0.9 % bolus 1,000 mL (1,000 mLs Intravenous New Bag/Given 08/01/23 1246)  ondansetron (ZOFRAN) injection 4 mg (4 mg Intravenous Given 08/01/23 1248)  fentaNYL (SUBLIMAZE) injection 50 mcg (50 mcg Intravenous Given 08/01/23 1247)  iohexol (OMNIPAQUE) 300 MG/ML solution 100 mL (100 mLs Intravenous Contrast Given 08/01/23 1305)     IMPRESSION / MDM / ASSESSMENT AND PLAN / ED COURSE  I reviewed the triage vital signs and the nursing notes.  Patient's presentation is most consistent with acute presentation with potential threat to life or bodily function.  Patient presents to the emergency department for lower abdominal discomfort intermittent and ongoing times many months but constant and more severe over the past 1 week.  Overall patient appears well, no distress describes her pain as moderate across the lower abdomen.  No urinary symptoms, no vomiting although she states sometimes she is nauseated she does have occasional diarrhea but that is her baseline.  We will check labs, urinalysis will obtain CT imaging of abdomen/pelvis and continue to closely monitor.  Patient's COVID test is negative, chemistry is reassuring besides mild dehydration, patient receiving fluids.  Lipase is normal LFTs are normal.  Patient CBC is reassuring with a normal white blood cell count, urinalysis shows no infection.  Patient CT scan resulted showing a left ovarian cyst otherwise no acute finding.  I discussed the patient symptoms she describes this is being more chronic with pain deep down into her pelvis.  Discussed the possibility of pelvic floor dysfunction that could be leading to discomfort (patient has had 5 children vaginally), along with her left ovarian cyst requiring GYN follow-up I believe GYN would be appropriate to follow-up given her chronic pelvic discomfort as well.  We will prescribe a short course of tramadol for the patient  and recommended use of Tylenol and then tramadol if needed for significant pain.  Patient will follow-up with GYN.  FINAL CLINICAL IMPRESSION(S) / ED DIAGNOSES   Lower abdominal pain Left ovarian cyst  Note:  This document was prepared using Dragon voice recognition software and may include unintentional dictation errors.   Minna Antis, MD 08/01/23 1506

## 2023-08-01 NOTE — ED Triage Notes (Signed)
Went to urgent care yesterday for her abd pain, pt states that she hasn't felt well since Sunday, pt states that she has a hx of ibs and reports no appetite not eaten since Sunday, pt reports small bm this am

## 2023-08-01 NOTE — Discharge Instructions (Addendum)
As we discussed your CT scan does show a left ovarian cyst.  Please follow-up with GYN by calling the number provided to arrange a follow-up appointment.  Your lower abdominal pain may be related to pelvic pain or pelvic floor dysfunction, please discuss this with GYN when you see them.  As we discussed please use your Tylenol as needed for discomfort as written on the box.  You may take your prescribed tramadol in addition to the Tylenol as needed for severe pain.  Do not drink alcohol or drive while taking this medication.

## 2023-08-05 ENCOUNTER — Other Ambulatory Visit: Payer: Self-pay | Admitting: Family

## 2023-08-05 ENCOUNTER — Ambulatory Visit
Admission: RE | Admit: 2023-08-05 | Discharge: 2023-08-05 | Disposition: A | Discharge status: Home / Self Care | Payer: Medicare HMO | Source: Ambulatory Visit | Attending: Family | Admitting: Family

## 2023-08-05 DIAGNOSIS — R102 Pelvic and perineal pain: Secondary | ICD-10-CM | POA: Diagnosis not present

## 2023-08-05 DIAGNOSIS — R9389 Abnormal findings on diagnostic imaging of other specified body structures: Secondary | ICD-10-CM | POA: Diagnosis not present

## 2023-08-05 DIAGNOSIS — N83202 Unspecified ovarian cyst, left side: Secondary | ICD-10-CM | POA: Diagnosis not present

## 2023-08-06 ENCOUNTER — Emergency Department: Payer: Medicare HMO

## 2023-08-06 ENCOUNTER — Observation Stay
Admission: EM | Admit: 2023-08-06 | Discharge: 2023-08-07 | Disposition: A | Discharge status: Home / Self Care | Payer: Medicare HMO | Attending: Internal Medicine | Admitting: Internal Medicine

## 2023-08-06 ENCOUNTER — Encounter: Payer: Self-pay | Admitting: Radiology

## 2023-08-06 ENCOUNTER — Inpatient Hospital Stay: Payer: Medicare HMO

## 2023-08-06 ENCOUNTER — Other Ambulatory Visit: Payer: Self-pay

## 2023-08-06 ENCOUNTER — Ambulatory Visit: Payer: Medicare HMO

## 2023-08-06 DIAGNOSIS — K8020 Calculus of gallbladder without cholecystitis without obstruction: Secondary | ICD-10-CM

## 2023-08-06 DIAGNOSIS — R109 Unspecified abdominal pain: Secondary | ICD-10-CM

## 2023-08-06 DIAGNOSIS — K5730 Diverticulosis of large intestine without perforation or abscess without bleeding: Secondary | ICD-10-CM | POA: Insufficient documentation

## 2023-08-06 DIAGNOSIS — R102 Pelvic and perineal pain: Secondary | ICD-10-CM | POA: Diagnosis not present

## 2023-08-06 DIAGNOSIS — Z7982 Long term (current) use of aspirin: Secondary | ICD-10-CM | POA: Diagnosis not present

## 2023-08-06 DIAGNOSIS — N179 Acute kidney failure, unspecified: Secondary | ICD-10-CM | POA: Diagnosis not present

## 2023-08-06 DIAGNOSIS — E860 Dehydration: Secondary | ICD-10-CM | POA: Diagnosis not present

## 2023-08-06 DIAGNOSIS — R1084 Generalized abdominal pain: Secondary | ICD-10-CM | POA: Diagnosis not present

## 2023-08-06 DIAGNOSIS — R1011 Right upper quadrant pain: Secondary | ICD-10-CM | POA: Diagnosis not present

## 2023-08-06 DIAGNOSIS — I10 Essential (primary) hypertension: Secondary | ICD-10-CM | POA: Diagnosis not present

## 2023-08-06 DIAGNOSIS — R932 Abnormal findings on diagnostic imaging of liver and biliary tract: Secondary | ICD-10-CM | POA: Diagnosis not present

## 2023-08-06 DIAGNOSIS — R112 Nausea with vomiting, unspecified: Secondary | ICD-10-CM | POA: Diagnosis not present

## 2023-08-06 DIAGNOSIS — E878 Other disorders of electrolyte and fluid balance, not elsewhere classified: Secondary | ICD-10-CM | POA: Diagnosis not present

## 2023-08-06 DIAGNOSIS — R1111 Vomiting without nausea: Secondary | ICD-10-CM | POA: Diagnosis not present

## 2023-08-06 DIAGNOSIS — N83202 Unspecified ovarian cyst, left side: Secondary | ICD-10-CM | POA: Diagnosis not present

## 2023-08-06 DIAGNOSIS — R9389 Abnormal findings on diagnostic imaging of other specified body structures: Secondary | ICD-10-CM | POA: Diagnosis not present

## 2023-08-06 LAB — CBC WITH DIFFERENTIAL/PLATELET
Abs Immature Granulocytes: 0.03 10*3/uL (ref 0.00–0.07)
Basophils Absolute: 0 10*3/uL (ref 0.0–0.1)
Basophils Relative: 0 %
Eosinophils Absolute: 0 10*3/uL (ref 0.0–0.5)
Eosinophils Relative: 1 %
HCT: 38.2 % (ref 36.0–46.0)
Hemoglobin: 12.7 g/dL (ref 12.0–15.0)
Immature Granulocytes: 0 %
Lymphocytes Relative: 16 %
Lymphs Abs: 1.3 10*3/uL (ref 0.7–4.0)
MCH: 27.1 pg (ref 26.0–34.0)
MCHC: 33.2 g/dL (ref 30.0–36.0)
MCV: 81.6 fL (ref 80.0–100.0)
Monocytes Absolute: 0.4 10*3/uL (ref 0.1–1.0)
Monocytes Relative: 5 %
Neutro Abs: 6.4 10*3/uL (ref 1.7–7.7)
Neutrophils Relative %: 78 %
Platelets: 369 10*3/uL (ref 150–400)
RBC: 4.68 MIL/uL (ref 3.87–5.11)
RDW: 12.3 % (ref 11.5–15.5)
WBC: 8.2 10*3/uL (ref 4.0–10.5)
nRBC: 0 % (ref 0.0–0.2)

## 2023-08-06 LAB — URINALYSIS, ROUTINE W REFLEX MICROSCOPIC
Bilirubin Urine: NEGATIVE
Glucose, UA: NEGATIVE mg/dL
Hgb urine dipstick: NEGATIVE
Ketones, ur: NEGATIVE mg/dL
Leukocytes,Ua: NEGATIVE
Nitrite: NEGATIVE
Protein, ur: NEGATIVE mg/dL
Specific Gravity, Urine: 1.009 (ref 1.005–1.030)
pH: 5 (ref 5.0–8.0)

## 2023-08-06 LAB — BASIC METABOLIC PANEL
Anion gap: 10 (ref 5–15)
BUN: 13 mg/dL (ref 8–23)
CO2: 25 mmol/L (ref 22–32)
Calcium: 9.5 mg/dL (ref 8.9–10.3)
Chloride: 103 mmol/L (ref 98–111)
Creatinine, Ser: 1.01 mg/dL — ABNORMAL HIGH (ref 0.44–1.00)
GFR, Estimated: 57 mL/min — ABNORMAL LOW (ref >60)
Glucose, Bld: 94 mg/dL (ref 70–99)
Potassium: 3.6 mmol/L (ref 3.5–5.1)
Sodium: 138 mmol/L (ref 135–145)

## 2023-08-06 LAB — MAGNESIUM: Magnesium: 2 mg/dL (ref 1.7–2.4)

## 2023-08-06 LAB — COMPREHENSIVE METABOLIC PANEL
ALT: 15 U/L (ref 0–44)
AST: 26 U/L (ref 15–41)
Albumin: 3.7 g/dL (ref 3.5–5.0)
Alkaline Phosphatase: 58 U/L (ref 38–126)
Anion gap: 10 (ref 5–15)
BUN: 18 mg/dL (ref 8–23)
CO2: 23 mmol/L (ref 22–32)
Calcium: 9.9 mg/dL (ref 8.9–10.3)
Chloride: 97 mmol/L — ABNORMAL LOW (ref 98–111)
Creatinine, Ser: 1.16 mg/dL — ABNORMAL HIGH (ref 0.44–1.00)
GFR, Estimated: 49 mL/min — ABNORMAL LOW (ref >60)
Glucose, Bld: 124 mg/dL — ABNORMAL HIGH (ref 70–99)
Potassium: 3 mmol/L — ABNORMAL LOW (ref 3.5–5.1)
Sodium: 130 mmol/L — ABNORMAL LOW (ref 135–145)
Total Bilirubin: 0.9 mg/dL (ref 0.3–1.2)
Total Protein: 7 g/dL (ref 6.5–8.1)

## 2023-08-06 LAB — CREATININE, URINE, RANDOM: Creatinine, Urine: 46 mg/dL

## 2023-08-06 LAB — LIPASE, BLOOD: Lipase: 45 U/L (ref 11–51)

## 2023-08-06 LAB — SODIUM, URINE, RANDOM: Sodium, Ur: 32 mmol/L

## 2023-08-06 LAB — LACTIC ACID, PLASMA: Lactic Acid, Venous: 1.3 mmol/L (ref 0.5–1.9)

## 2023-08-06 MED ORDER — MORPHINE SULFATE (PF) 4 MG/ML IV SOLN: 4.0000 mg | INTRAVENOUS | Status: DC | PRN

## 2023-08-06 MED ORDER — LACTATED RINGERS IV BOLUS
1000.0000 mL | Freq: Once | INTRAVENOUS | Status: AC
  Administered 2023-08-06: 1000 mL via INTRAVENOUS

## 2023-08-06 MED ORDER — IOHEXOL 350 MG/ML SOLN
100.0000 mL | Freq: Once | INTRAVENOUS | Status: AC | PRN
  Administered 2023-08-06: 100 mL via INTRAVENOUS

## 2023-08-06 MED ORDER — ENOXAPARIN SODIUM 40 MG/0.4ML IJ SOSY: 40.0000 mg | PREFILLED_SYRINGE | INTRAMUSCULAR | Status: DC

## 2023-08-06 MED ORDER — ONDANSETRON HCL 4 MG/2ML IJ SOLN
4.0000 mg | Freq: Once | INTRAMUSCULAR | Status: AC
  Administered 2023-08-06: 4 mg via INTRAVENOUS
  Filled 2023-08-06: qty 2

## 2023-08-06 MED ORDER — ONDANSETRON HCL 4 MG/2ML IJ SOLN: 4.0000 mg | Freq: Four times a day (QID) | INTRAMUSCULAR | Status: DC | PRN

## 2023-08-06 MED ORDER — ENOXAPARIN SODIUM 30 MG/0.3ML IJ SOSY
30.0000 mg | PREFILLED_SYRINGE | INTRAMUSCULAR | Status: DC
  Administered 2023-08-06: 30 mg via SUBCUTANEOUS
  Filled 2023-08-06: qty 0.3

## 2023-08-06 MED ORDER — HYDRALAZINE HCL 20 MG/ML IJ SOLN
10.0000 mg | INTRAMUSCULAR | Status: DC | PRN
  Administered 2023-08-07: 10 mg via INTRAVENOUS
  Filled 2023-08-06: qty 1

## 2023-08-06 MED ORDER — PANTOPRAZOLE SODIUM 40 MG IV SOLR
40.0000 mg | Freq: Two times a day (BID) | INTRAVENOUS | Status: DC
  Administered 2023-08-06 (×2): 40 mg via INTRAVENOUS
  Filled 2023-08-06 (×2): qty 10

## 2023-08-06 MED ORDER — TECHNETIUM TC 99M MEBROFENIN IV KIT
5.0000 | PACK | Freq: Once | INTRAVENOUS | Status: AC | PRN
  Administered 2023-08-06: 5.33 via INTRAVENOUS

## 2023-08-06 MED ORDER — MORPHINE SULFATE (PF) 4 MG/ML IV SOLN
4.0000 mg | Freq: Once | INTRAVENOUS | Status: AC
  Administered 2023-08-06: 4 mg via INTRAVENOUS
  Filled 2023-08-06: qty 1

## 2023-08-06 MED ORDER — POTASSIUM CHLORIDE IN NACL 20-0.9 MEQ/L-% IV SOLN
INTRAVENOUS | Status: DC
  Filled 2023-08-06 (×4): qty 1000

## 2023-08-06 MED ORDER — POTASSIUM CHLORIDE 10 MEQ/100ML IV SOLN
10.0000 meq | INTRAVENOUS | Status: AC
  Administered 2023-08-06 (×3): 10 meq via INTRAVENOUS
  Filled 2023-08-06 (×3): qty 100

## 2023-08-06 NOTE — ED Provider Notes (Signed)
[facility] Provider Note    Event Date/Time   First MD Initiated Contact with Patient 08/06/23 [phone]     (approximate)   History   Abdominal Pain, Nausea, and Emesis   HPI  Tracy Nicholson is a 77 y.o. female  here with nausea, vomiting, diarrhea, inability to tolerate PO. Pt reports that for the past 1 week, she has had constant nausea, vomiting, and inability to keep anything down. She was seen here 9/12 and had largely unremarkable CT, other than ovarian cyst which was unremarkable on U/S yesterday. She had endometrial thickening but no vaginal bleeding or pain. She describes a constant, aching, gnawing epigastric and lower abdominal pain. She has been unable to keep anything down. Denies any fevers, chills. No recent med changes. She has been taking zofran, antiemetics and other meds w/o relief.       Physical Exam   Triage Vital Signs: ED Triage Vitals  Encounter Vitals Group     BP 08/06/23 0627 (!) 172/81     Systolic BP Percentile --      Diastolic BP Percentile --      Pulse Rate 08/06/23 0627 69     Resp 08/06/23 0627 16     Temp 08/06/23 0627 97.7 F (36.5 C)     Temp Source 08/06/23 0627 Oral     SpO2 08/06/23 0627 97 %     Weight 08/06/23 0628 120 lb (54.4 kg)     Height 08/06/23 0628 4\' 8"  (1.422 m)     Head Circumference --      Peak Flow --      Pain Score 08/06/23 0628 10     Pain Loc --      Pain Education --      Exclude from Growth Chart --     Most recent vital signs: Vitals:   08/06/23 1330 08/06/23 1400  BP: (!) 160/68 (!) 160/71  Pulse: 72 75  Resp: 14 12  Temp:    SpO2: 99% 98%     General: Awake, no distress.  CV:  Good peripheral perfusion. Regular rate and rhythm. Resp:  Normal work of breathing. Lungs clear bilaterally. Abd:  No distention. Moderate suprapubic and epigastric TTP. No guarding. Other:  Dry MM.   ED Results / Procedures / Treatments   Labs (all labs ordered are listed, but only  abnormal results are displayed) Labs Reviewed  COMPREHENSIVE METABOLIC PANEL - Abnormal; Notable for the following components:      Result Value   Sodium 130 (*)    Potassium 3.0 (*)    Chloride 97 (*)    Glucose, Bld 124 (*)    Creatinine, Ser 1.16 (*)    GFR, Estimated 49 (*)    All other components within normal limits  URINALYSIS, ROUTINE W REFLEX MICROSCOPIC - Abnormal; Notable for the following components:   Color, Urine YELLOW (*)    APPearance CLEAR (*)    All other components within normal limits  CBC WITH DIFFERENTIAL/PLATELET  LIPASE, BLOOD  LACTIC ACID, PLASMA  MAGNESIUM  CREATININE, URINE, RANDOM  SODIUM, URINE, RANDOM  BASIC METABOLIC PANEL     EKG Normal sinus rhythm, ventricular rate 90.  PR 177, QRS 150, QTc 491.  No acute ST elevations or depressions.  Right bundle branch block.   RADIOLOGY RUQ [place]: Cholelithiasis, no acute cholecystitis CT A/P: No acute abnormality, no ischemia   I also independently reviewed and agree with radiologist interpretations.   PROCEDURES:  Critical Care performed: No  .1-3 Lead EKG Interpretation  Performed by: Shaune Pollack, MD Authorized by: Shaune Pollack, MD     Interpretation: normal     ECG rate:  70-90   ECG rate assessment: normal     Rhythm: sinus rhythm     Ectopy: none     Conduction: normal   Comments:     Indication: Nausea, vomiting     MEDICATIONS ORDERED IN ED: Medications  morphine (PF) 4 MG/ML injection 4 mg (has no administration in time range)  ondansetron (ZOFRAN) injection 4 mg (has no administration in time range)  0.9 % NaCl with KCl 20 mEq/ L  infusion (0 mL/hr Intravenous Paused 08/06/23 1508)  enoxaparin (LOVENOX) injection 30 mg (has no administration in time range)  hydrALAZINE (APRESOLINE) injection 10 mg (has no administration in time range)  pantoprazole (PROTONIX) injection 40 mg (40 mg Intravenous Given 08/06/23 1035)  lactated ringers bolus 1,000 mL (0 mLs Intravenous  Stopped 08/06/23 1032)  potassium chloride 10 mEq in 100 mL IVPB (0 mEq Intravenous Stopped 08/06/23 1300)  morphine (PF) 4 MG/ML injection 4 mg (4 mg Intravenous Given 08/06/23 0910)  ondansetron (ZOFRAN) injection 4 mg (4 mg Intravenous Given 08/06/23 0910)  iohexol (OMNIPAQUE) 350 MG/ML injection 100 mL (100 mLs Intravenous Contrast Given 08/06/23 0759)     IMPRESSION / MDM / ASSESSMENT AND PLAN / ED COURSE  I reviewed the triage vital signs and the nursing notes.                              Differential diagnosis includes, but is not limited to, gastritis, PUD, diverticulitis that did not initially show on CT, enteritis, IBS, symptomatic gallstones, mesenteric ischemia, medication adverse effect  Patient's presentation is most consistent with acute presentation with potential threat to life or bodily function.  The patient is on the cardiac monitor to evaluate for evidence of arrhythmia and/or significant heart rate changes  77 yo F with PMHx HTN, HLD, arthritis, here with n/v x 1 week, intractable pain despite taking Zofran, prescribed analgesics. CT 9/12 unremarkable. Pt appears dehydrated, fatigued and labs show worsening hyponatremia, hypokalemia and elevating Cr c/w dehydration. Will check CT angio to eval for possible mesenteric ischemia, as well as U/S given +gallstones on CT 9/12.   CT scan shows no major arterial occlusions, no other acute findings. +cholelithiasis is again noted. Pt remains nauseous, vomiting despite medications. Will admit for hydration, sx control, further w/u.    FINAL CLINICAL IMPRESSION(S) / ED DIAGNOSES   Final diagnoses:  Intractable nausea and vomiting  Dehydration     Rx / DC Orders   ED Discharge Orders     None        Note:  This document was prepared using Dragon voice recognition software and may include unintentional dictation errors.   Shaune Pollack, MD 08/06/23 [phone]

## 2023-08-06 NOTE — Assessment & Plan Note (Signed)
Sodium 130 as well as potassium of 3 in the setting of intractable nausea and vomiting Clinically dry Will hydrate patient Replete Check mag level Follow

## 2023-08-06 NOTE — ED Notes (Signed)
Informed RN-Reonna via message/ pt has bed assigned @ 19:16

## 2023-08-06 NOTE — Progress Notes (Signed)
PHARMACIST - PHYSICIAN COMMUNICATION  CONCERNING:  Enoxaparin (Lovenox) for DVT Prophylaxis    RECOMMENDATION: Patient was prescribed enoxaprin 40mg  q24 hours for VTE prophylaxis.   Filed Weights   08/06/23 0628  Weight: 54.4 kg (120 lb)    Body mass index is 26.9 kg/m.  Estimated Creatinine Clearance: 27.9 mL/min (A) (by C-G formula based on SCr of 1.16 mg/dL (H)).  Patient is candidate for enoxaparin 30mg  every 24 hours based on CrCl <97ml/min or Weight <45kg  DESCRIPTION: Pharmacy has adjusted enoxaparin dose per [facility] policy.  Patient is now receiving enoxaparin 30 mg every 24 hours    Merryl Hacker, PharmD Clinical Pharmacist  08/06/2023 9:34 AM

## 2023-08-06 NOTE — Assessment & Plan Note (Signed)
Intractable nausea vomiting abdominal pain x 1 week Has had multiple ER evaluations with imaging notable for ovarian cyst, diverticulosis without diverticulitis as well as cholelithiasis Pending outpatient GYN evaluation for thickened endometrium On exam today, patient with moderate to severe tenderness palpation over the right upper quadrant as well as epigastric area Will start on IV PPI Right upper quadrant ultrasound pending Will also reach out to surgery for evaluation as it may be a component of biliary colic with symptoms Does not appear to have active diverticulitis flare at present-defer antibiotics Monitor closely

## 2023-08-06 NOTE — H&P (Addendum)
History and Physical    Patient: Tracy Nicholson AOZ:[id number] DOB: 11/29/1945 DOA: 08/06/2023 DOS: the patient was seen and examined on 08/06/2023 PCP: Marina Goodell, MD  Patient coming from: Home  Chief Complaint:  Chief Complaint  Patient presents with   Abdominal Pain   Nausea   Emesis   HPI: Tracy Nicholson is a 77 y.o. female with medical history significant of arthritis, hyperlipidemia, hypertension presenting with intractable nausea and vomiting as well as abdominal pain.  Patient is noted to have recurrent generalized abdominal pain as well as recurrent nausea vomiting over the past week or so.  Denies any prior episodes like this in the past.  Has been seen multiple times in ER for symptoms.  Had recent evaluation including pelvic ultrasound with concern for ovarian cyst as well as thickened endometrial stripe with plan outpatient OB/GYN evaluation for this.  Still with abdominal pain.  Abdominal pain generalized with strong lower abdominal component though with significant right upper quadrant as well as epigastric tenderness.  Has had no ability to tolerate p.o. intake.  1 episode of diarrhea.  Presents to the ER afebrile, hemodynamically stable.  White count 8.2, hemoglobin 12.7, platelets 369, lactate 1.3, urinalysis stable, creatinine 1.16, sodium 130, potassium 3.0. CT abdomen pelvis notable for cholelithiasis, sigmoid diverticulosis without diverticulitis, left adnexal cyst.  Pending right upper quadrant ultrasound.  Review of Systems: As mentioned in the history of present illness. All other systems reviewed and are negative. Past Medical History:  Diagnosis Date   Arthritis    Hyperlipemia    Hypertension    Past Surgical History:  Procedure Laterality Date   CATARACT EXTRACTION W/PHACO Right 12/25/2022   Procedure: CATARACT EXTRACTION PHACO AND INTRAOCULAR LENS PLACEMENT (IOC) RIGHT 7.16 00:49.0;  Surgeon: Estanislado Pandy, MD;  Location: [facility];  Service: Ophthalmology;  Laterality: Right;   CATARACT EXTRACTION W/PHACO Left 01/10/2023   Procedure: CATARACT EXTRACTION PHACO AND INTRAOCULAR LENS PLACEMENT (IOC) LEFT  7.85  00:54.5;  Surgeon: Estanislado Pandy, MD;  Location: [facility];  Service: Ophthalmology;  Laterality: Left;   COLONOSCOPY     Social History:  reports that she has never smoked. She has never used smokeless tobacco. She reports that she does not drink alcohol and does not use drugs.  No Known Allergies  Family History  Problem Relation Age of Onset   Coronary artery disease Mother    Stroke Father     Prior to Admission medications   Medication Sig Start Date End Date Taking? Authorizing Provider  aspirin 81 MG tablet Take 81 mg by mouth daily.    [provider]  losartan (COZAAR) 25 MG tablet Take 25 mg by mouth daily.    [provider]  MAGNESIUM CITRATE PO Take by mouth daily.    [provider]  meloxicam (MOBIC) 7.5 MG tablet Take 1 tablet (7.5 mg total) by mouth daily. Patient not taking: Reported on 12/18/2022 06/26/21   Tommie Sams, DO  Multiple Vitamin (MULTIVITAMIN) tablet Take 1 tablet by mouth daily.    [provider]  Multiple Vitamins-Minerals (PRESERVISION AREDS 2 PO) Take by mouth daily.    [provider]  Omega-3 Fatty Acids (FISH OIL) 1000 MG CAPS Take by mouth daily.    [provider]  rosuvastatin (CRESTOR) 20 MG tablet Take 20 mg by mouth daily.    [provider]  traMADol (ULTRAM) 50 MG tablet Take 1 tablet (50 mg total) by mouth  every 6 (six) hours as needed. 08/01/23   Minna Antis, MD  triamterene-hydrochlorothiazide (MAXZIDE-25) 37.5-25 MG tablet Take 1 tablet by mouth daily.    [provider]  fluticasone (FLONASE) 50 MCG/ACT nasal spray Place 2 sprays into both nostrils daily. 12/01/15 04/11/20  Lutricia Feil, PA-C    Physical Exam: Vitals:   08/06/23 0627 08/06/23 0628  08/06/23 0630 08/06/23 0700  BP: (!) 172/81  (!) 166/80 (!) 165/80  Pulse: 69  73 76  Resp: 16     Temp: 97.7 F (36.5 C)     TempSrc: Oral     SpO2: 97%  99% 97%  Weight:  54.4 kg    Height:  4\' 8"  (1.422 m)     Physical Exam Constitutional:      Appearance: She is normal weight.  HENT:     Head: Normocephalic and atraumatic.     Nose: Nose normal.     Mouth/Throat:     Mouth: Mucous membranes are moist.  Eyes:     Pupils: Pupils are equal, round, and reactive to light.  Cardiovascular:     Rate and Rhythm: Normal rate and regular rhythm.  Pulmonary:     Effort: Pulmonary effort is normal.     Breath sounds: Normal breath sounds.  Abdominal:     General: Bowel sounds are normal.     Comments: + abd pain in epigastric and RUQ region w/ TTP  + mild lower abd tenderness    Musculoskeletal:        General: Normal range of motion.     Cervical back: Normal range of motion.  Skin:    General: Skin is warm.  Neurological:     General: No focal deficit present.  Psychiatric:        Thought Content: Thought content normal.     Data Reviewed:  There are no new results to review at this time.  CT Angio Abd/Pel W and/or Wo Contrast CLINICAL DATA:  Mesenteric ischemia, acute  EXAM: CTA ABDOMEN AND PELVIS WITHOUT AND WITH CONTRAST  TECHNIQUE: Multidetector CT imaging of the abdomen and pelvis was performed using the standard protocol during bolus administration of intravenous contrast. Multiplanar reconstructed images and MIPs were obtained and reviewed to evaluate the vascular anatomy.  RADIATION DOSE REDUCTION: This exam was performed according to the departmental dose-optimization program which includes automated exposure control, adjustment of the mA and/or kV according to patient size and/or use of iterative reconstruction technique.  CONTRAST:  OMNIPAQUE IOHEXOL 350 MG/ML SOLN  COMPARISON:  August 01, 2023  FINDINGS: VASCULAR  Aorta: Normal  caliber with scattered atherosclerotic plaque.  Celiac: Patent without significant stenosis.  SMA: Patent without significant stenosis. No filling defects identified in the distal branches.  IMA: Patent.  Renals: Both renal arteries are patent without evidence of aneurysm, dissection, vasculitis, fibromuscular dysplasia or significant stenosis. Single renal arteries bilaterally.  Inflow: Patent without evidence of aneurysm, dissection, vasculitis or significant stenosis.  Veins: Portal venous system is patent. Systemic veins are unremarkable.  NON-VASCULAR  Inferior chest: The lung bases are well-aerated.  Hepatobiliary: Liver is normal in size. A couple of small subcentimeter hypodense lesions in the liver are too small to definitively characterize but favored benign and requiring no further imaging workup. No intrahepatic or extrahepatic biliary ductal dilation. Cholelithiasis.  Spleen: Normal in size without focal abnormality.  Pancreas: No pancreatic ductal dilatation or surrounding inflammatory changes.  Adrenals/Urinary Tract: Adrenal glands are unremarkable. Kidneys are normal in size  without suspicious mass or hydronephrosis. Bladder is unremarkable.  Stomach/Bowel: The stomach, small bowel and large bowel are normal in caliber without abnormal wall thickening or surrounding inflammatory changes. Extensive sigmoid diverticulosis without active inflammatory changes. Appendix is normal.  Reproductive: Left adnexal cystic lesion better characterized on recent pelvic ultrasound, refer to separate report. Otherwise unremarkable.  Lymphatic: No enlarged lymph nodes in the abdomen or pelvis.  Other: No abdominopelvic ascites.  Musculoskeletal: No aggressive osseous lesions. The soft tissues are unremarkable.  IMPRESSION: VASCULAR  1. The mesenteric arteries and portal veins are widely patent. No findings of acute or chronic mesenteric  ischemia.  NON-VASCULAR  1. No acute findings in the abdomen or pelvis. 2. Cholelithiasis. 3. Sigmoid diverticulosis without active inflammatory changes. 4. Left adnexal cystic lesion, better characterized on recent pelvic ultrasound. Refer to separate report.  Electronically Signed   By: Olive Bass M.D.   On: 08/06/2023 08:30  Lab Results  Component Value Date   WBC 8.2 08/06/2023   HGB 12.7 08/06/2023   HCT 38.2 08/06/2023   MCV 81.6 08/06/2023   PLT 369 08/06/2023   Last metabolic panel Lab Results  Component Value Date   GLUCOSE 124 (H) 08/06/2023   NA 130 (L) 08/06/2023   K 3.0 (L) 08/06/2023   CL 97 (L) 08/06/2023   CO2 23 08/06/2023   BUN 18 08/06/2023   CREATININE 1.16 (H) 08/06/2023   GFRNONAA 49 (L) 08/06/2023   CALCIUM 9.9 08/06/2023   PROT 7.0 08/06/2023   ALBUMIN 3.7 08/06/2023   BILITOT 0.9 08/06/2023   ALKPHOS 58 08/06/2023   AST 26 08/06/2023   ALT 15 08/06/2023   ANIONGAP 10 08/06/2023    Assessment and Plan: * Intractable nausea and vomiting Intractable nausea vomiting abdominal pain x 1 week Has had multiple ER evaluations with imaging notable for ovarian cyst, diverticulosis without diverticulitis as well as cholelithiasis Pending outpatient GYN evaluation for thickened endometrium On exam today, patient with moderate to severe tenderness palpation over the right upper quadrant as well as epigastric area Will start on IV PPI Right upper quadrant ultrasound pending Will also reach out to surgery for evaluation as it may be a component of biliary colic with symptoms Does not appear to have active diverticulitis flare at present-defer antibiotics Monitor closely  AKI (acute kidney injury) (HCC) Creatinine 1.2 with GFR in the 40s in the setting of intractable nausea and vomiting Clinically dry IV fluid hydration Check FENa Hold nephrotoxic agents Monitor  Hypertension BP stable Titrate home regimen  Electrolyte abnormality Sodium  130 as well as potassium of 3 in the setting of intractable nausea and vomiting Clinically dry Will hydrate patient Replete Check mag level Follow       Advance Care Planning:   Code Status: Full Code   Consults: General Surgery   Family Communication: Family at the bedside   Severity of Illness: The appropriate patient status for this patient is OBSERVATION. Observation status is judged to be reasonable and necessary in order to provide the required intensity of service to ensure the patient's safety. The patient's presenting symptoms, physical exam findings, and initial radiographic and laboratory data in the context of their medical condition is felt to place them at decreased risk for further clinical deterioration. Furthermore, it is anticipated that the patient will be medically stable for discharge from the hospital within 2 midnights of admission.   Author: Floydene Flock, MD 08/06/2023 9:52 AM  For on call review [email or web address].

## 2023-08-06 NOTE — ED Notes (Signed)
Pt going to nuclear medicine for scan.

## 2023-08-06 NOTE — ED Notes (Signed)
2c notified of patient departing from the ER to assigned bed

## 2023-08-06 NOTE — ED Triage Notes (Signed)
Patient to arrive from home via [facility]. Patient c/o lower abdominal and pelvic pain that has been going for about a week. Patient has been seen in the last week at this ER for similar. Patient states she has had n/v/ soft-loose stool.   EMS gave 20 g lac, 4 mg zofran  VSS, but hypertensive  170/90

## 2023-08-06 NOTE — ED Notes (Signed)
Pt back from [facility].

## 2023-08-06 NOTE — Assessment & Plan Note (Signed)
Creatinine 1.2 with GFR in the 40s in the setting of intractable nausea and vomiting Clinically dry IV fluid hydration Check FENa Hold nephrotoxic agents Monitor

## 2023-08-06 NOTE — ED Notes (Signed)
Pt resting in bed, family at bedside. Pt updated on plan of care and denies any needs at this time.

## 2023-08-06 NOTE — Plan of Care (Signed)
  Problem: Education: Goal: Knowledge of General Education information will improve Description: Including pain rating scale, medication(s)/side effects and non-pharmacologic comfort measures 08/06/2023 2311 by Hortencia Pilar, RN Outcome: Progressing 08/06/2023 2113 by Hortencia Pilar, RN Outcome: Progressing   Problem: Health Behavior/Discharge Planning: Goal: Ability to manage health-related needs will improve 08/06/2023 2311 by Hortencia Pilar, RN Outcome: Progressing 08/06/2023 2113 by Hortencia Pilar, RN Outcome: Progressing   Problem: Clinical Measurements: Goal: Ability to maintain clinical measurements within normal limits will improve 08/06/2023 2311 by Hortencia Pilar, RN Outcome: Progressing 08/06/2023 2113 by Hortencia Pilar, RN Outcome: Progressing Goal: Will remain free from infection 08/06/2023 2311 by Hortencia Pilar, RN Outcome: Progressing 08/06/2023 2113 by Hortencia Pilar, RN Outcome: Progressing Goal: Diagnostic test results will improve 08/06/2023 2311 by Hortencia Pilar, RN Outcome: Progressing 08/06/2023 2113 by Hortencia Pilar, RN Outcome: Progressing Goal: Respiratory complications will improve 08/06/2023 2311 by Hortencia Pilar, RN Outcome: Progressing 08/06/2023 2113 by Hortencia Pilar, RN Outcome: Progressing Goal: Cardiovascular complication will be avoided 08/06/2023 2311 by Hortencia Pilar, RN Outcome: Progressing 08/06/2023 2113 by Hortencia Pilar, RN Outcome: Progressing   Problem: Activity: Goal: Risk for activity intolerance will decrease 08/06/2023 2311 by Hortencia Pilar, RN Outcome: Progressing 08/06/2023 2113 by Hortencia Pilar, RN Outcome: Progressing   Problem: Nutrition: Goal: Adequate nutrition will be maintained 08/06/2023 2311 by Hortencia Pilar, RN Outcome: Progressing 08/06/2023 2113 by Hortencia Pilar, RN Outcome: Progressing   Problem: Coping: Goal: Level of anxiety will decrease 08/06/2023 2311 by Hortencia Pilar, RN Outcome: Progressing 08/06/2023 2113 by Hortencia Pilar, RN Outcome: Progressing   Problem: Elimination: Goal: Will not experience complications related to bowel motility 08/06/2023 2311 by Hortencia Pilar, RN Outcome: Progressing 08/06/2023 2113 by Hortencia Pilar, RN Outcome: Progressing Goal: Will not experience complications related to urinary retention 08/06/2023 2311 by Hortencia Pilar, RN Outcome: Progressing 08/06/2023 2113 by Hortencia Pilar, RN Outcome: Progressing   Problem: Pain Managment: Goal: General experience of comfort will improve 08/06/2023 2311 by Hortencia Pilar, RN Outcome: Progressing 08/06/2023 2113 by Hortencia Pilar, RN Outcome: Progressing   Problem: Safety: Goal: Ability to remain free from injury will improve 08/06/2023 2311 by Hortencia Pilar, RN Outcome: Progressing 08/06/2023 2113 by Hortencia Pilar, RN Outcome: Progressing   Problem: Skin Integrity: Goal: Risk for impaired skin integrity will decrease 08/06/2023 2311 by Hortencia Pilar, RN Outcome: Progressing 08/06/2023 2113 by Hortencia Pilar, RN Outcome: Progressing

## 2023-08-06 NOTE — Plan of Care (Signed)

## 2023-08-06 NOTE — Assessment & Plan Note (Signed)
BP stable Titrate home regimen

## 2023-08-06 NOTE — Consult Note (Signed)
[facility] SURGICAL CONSULTATION NOTE (initial) - cpt: 29562   HISTORY OF PRESENT ILLNESS (HPI):  77 y.o. female presented to [facility] ED today for evaluation of abdominal pain. Patient with multiple presentations to UC and ED over the course of the last 6 days. Initially presented to urgent care on 09/11 for evaluation of lower abdominal pain with intractable nausea and emesis. UA was checked and negative, she was discharged home with strict precautions. Symptoms continued into 09/12 and she went to the ED. Work up at that time showed cholelithiasis as well as left ovarian cyst. She was discharged with GYN follow up secondary to cyst findings. She presents again to the ED today as she has continued to have intractable nausea/emesis as wall as epigastric and lower abdominal pain. She has been taking Zofran without relief. No fever, chills, cough, CP, SOB, urinary changes. Did have a soft BM yesterday. No previous abdominal surgeries. Work up today revealed a normal WBC at 8.2, Hgb to 12.7, hyponatremia to 130, baseline renal function with sCr 1.16, lipase normal at 45, bilirubin normal at 0.9. She had CT Abdomen/Pelvis which again showed cholelithiasis without overt changes to suggest cholecystitis and left adnexal cyst. She did have RUQ [place] which is pending. She is admitted to the medicine service for intractable nausea/emesis.   Surgery is consulted by hospitalist physician Dr. Doree Albee, MD in this context for evaluation and management of intractable nausea/emesis and abdominal pain in setting of cholelithiasis.    PAST MEDICAL HISTORY (PMH):  Past Medical History:  Diagnosis Date   Arthritis    Hyperlipemia    Hypertension      PAST SURGICAL HISTORY (PSH):  Past Surgical History:  Procedure Laterality Date   CATARACT EXTRACTION W/PHACO Right 12/25/2022   Procedure: CATARACT EXTRACTION PHACO AND INTRAOCULAR LENS PLACEMENT (IOC) RIGHT 7.16 00:49.0;  Surgeon: Estanislado Pandy, MD;  Location: [facility];  Service: Ophthalmology;  Laterality: Right;   CATARACT EXTRACTION W/PHACO Left 01/10/2023   Procedure: CATARACT EXTRACTION PHACO AND INTRAOCULAR LENS PLACEMENT (IOC) LEFT  7.85  00:54.5;  Surgeon: Estanislado Pandy, MD;  Location: [facility];  Service: Ophthalmology;  Laterality: Left;   COLONOSCOPY       MEDICATIONS:  Prior to Admission medications   Medication Sig Start Date End Date Taking? Authorizing Provider  aspirin 81 MG tablet Take 81 mg by mouth daily.    [provider]  losartan (COZAAR) 25 MG tablet Take 25 mg by mouth daily.    [provider]  MAGNESIUM CITRATE PO Take by mouth daily.    [provider]  meloxicam (MOBIC) 7.5 MG tablet Take 1 tablet (7.5 mg total) by mouth daily. Patient not taking: Reported on 12/18/2022 06/26/21   Tommie Sams, DO  Multiple Vitamin (MULTIVITAMIN) tablet Take 1 tablet by mouth daily.    [provider]  Multiple Vitamins-Minerals (PRESERVISION AREDS 2 PO) Take by mouth daily.    [provider]  Omega-3 Fatty Acids (FISH OIL) 1000 MG CAPS Take by mouth daily.    [provider]  rosuvastatin (CRESTOR) 20 MG tablet Take 20 mg by mouth daily.    [provider]  traMADol (ULTRAM) 50 MG tablet Take 1 tablet (50 mg total) by mouth every 6 (six) hours as needed. 08/01/23   Minna Antis, MD  triamterene-hydrochlorothiazide (MAXZIDE-25) 37.5-25 MG tablet Take 1 tablet by mouth daily.    [provider]  fluticasone (FLONASE) 50 MCG/ACT nasal spray Place  2 sprays into both nostrils daily. 12/01/15 04/11/20  Lutricia Feil, PA-C     ALLERGIES:  No Known Allergies   SOCIAL HISTORY:  Social History   Socioeconomic History   Marital status: Married    Spouse name: Not on file   Number of children: Not on file   Years of education: Not on file   Highest education level: Not on file  Occupational History   Not on  file  Tobacco Use   Smoking status: Never   Smokeless tobacco: Never  Vaping Use   Vaping status: Never Used  Substance and Sexual Activity   Alcohol use: No   Drug use: No   Sexual activity: Not on file  Other Topics Concern   Not on file  Social History Narrative   Not on file   Social Determinants of Health   Financial Resource Strain: Not on file  Food Insecurity: Not on file  Transportation Needs: Not on file  Physical Activity: Not on file  Stress: Not on file  Social Connections: Not on file  Intimate Partner Violence: Not on file     FAMILY HISTORY:  Family History  Problem Relation Age of Onset   Coronary artery disease Mother    Stroke Father       REVIEW OF SYSTEMS:  Review of Systems  Constitutional:  Negative for chills and fever.  HENT:  Negative for congestion and sore throat.   Respiratory:  Negative for cough and shortness of breath.   Cardiovascular:  Negative for chest pain and palpitations.  Gastrointestinal:  Positive for abdominal pain, nausea and vomiting. Negative for constipation and diarrhea.  Genitourinary:  Negative for dysuria and urgency.  All other systems reviewed and are negative.   VITAL SIGNS:  Temp:  [97.7 F (36.5 C)] 97.7 F (36.5 C) (09/17 0627) Pulse Rate:  [69-76] 76 (09/17 0700) Resp:  [16] 16 (09/17 0627) BP: (165-172)/(80-81) 165/80 (09/17 0700) SpO2:  [97 %-99 %] 97 % (09/17 0700) Weight:  [54.4 kg] 54.4 kg (09/17 0628)     Height: 4\' 8"  (142.2 cm) Weight: 54.4 kg BMI (Calculated): 26.92   INTAKE/OUTPUT:  No intake/output data recorded.  PHYSICAL EXAM:  Physical Exam Vitals and nursing note reviewed. Exam conducted with a chaperone present.  Constitutional:      General: She is not in acute distress.    Appearance: She is well-developed and normal weight. She is not ill-appearing.     Comments: Patient resting in bed; NAD. Family at bedside   HENT:     Head: Normocephalic and atraumatic.  Eyes:      General: No scleral icterus.    Extraocular Movements: Extraocular movements intact.  Cardiovascular:     Rate and Rhythm: Normal rate.     Heart sounds: Normal heart sounds.  Pulmonary:     Effort: Pulmonary effort is normal.     Breath sounds: Normal breath sounds.  Abdominal:     General: Abdomen is flat.     Palpations: Abdomen is soft.     Tenderness: There is abdominal tenderness in the suprapubic area. There is no guarding or rebound. Negative signs include Murphy's sign.     Comments: Abdomen is soft, her tenderness is certainly more focal and worse in the suprapubic region, she has very mild soreness with very deep palpation in RUQ although Murphy's Sign is grossly negative, non-distended, no rebound/guarding. She is certainly not peritonitic   Genitourinary:    Comments: Deferred Skin:  General: Skin is warm and dry.     Coloration: Skin is not jaundiced.  Neurological:     General: No focal deficit present.     Mental Status: She is alert and oriented to person, place, and time.  Psychiatric:        Mood and Affect: Mood normal.        Behavior: Behavior normal.      Labs:     Latest Ref Rng & Units 08/06/2023    6:30 AM 08/01/2023   11:48 AM  CBC  WBC 4.0 - 10.5 K/uL 8.2  8.9   Hemoglobin 12.0 - 15.0 g/dL 08.6  57.8   Hematocrit 36.0 - 46.0 % 38.2  43.1   Platelets 150 - 400 K/uL 369  388       Latest Ref Rng & Units 08/06/2023    6:30 AM 08/01/2023   11:48 AM  CMP  Glucose 70 - 99 mg/dL 469  629   BUN 8 - 23 mg/dL 18  20   Creatinine 5.28 - 1.00 mg/dL 4.13  2.44   Sodium 010 - 145 mmol/L 130  133   Potassium 3.5 - 5.1 mmol/L 3.0  3.4   Chloride 98 - 111 mmol/L 97  96   CO2 22 - 32 mmol/L 23  22   Calcium 8.9 - 10.3 mg/dL 9.9  9.7   Total Protein 6.5 - 8.1 g/dL 7.0  8.2   Total Bilirubin 0.3 - 1.2 mg/dL 0.9  1.3   Alkaline Phos 38 - 126 U/L 58  72   AST 15 - 41 U/L 26  31   ALT 0 - 44 U/L 15  17      Imaging studies:   CTA Abdomen/Pelvis  (08/06/2023) personally reviewed without evidence of mesenteric ischemia, she was again found to have cholelithiasis, no changes to suggest cholecystitis, no evidence of obstruction, no pneumoperitoneum, no pneumatosis, she does have diverticulosis without diverticulitis, and radiologist report reviewed below:  IMPRESSION: VASCULAR   1. The mesenteric arteries and portal veins are widely patent. No findings of acute or chronic mesenteric ischemia.   NON-VASCULAR   1. No acute findings in the abdomen or pelvis. 2. Cholelithiasis. 3. Sigmoid diverticulosis without active inflammatory changes. 4. Left adnexal cystic lesion, better characterized on recent pelvic ultrasound. Refer to separate report   RUQ [place] (08/06/2023) personally reviewed with cholelithiasis without evidence of cholecystitis, and radiologist report reviewed below:  IMPRESSION: Cholelithiasis without sonographic evidence of acute cholecystitis.   \ Assessment/Plan: (ICD-10's: K40.20) 77 y.o. female with intractable nausea/emesis and primarily suprapubic abdominal pain found to have cholelithiasis without evidence of cholecystitis nor choledocholithiasis, also with left adnexal cyst   - She certainly has marked cholelithiasis on imaging studies; however, her pain is primarily in suprapubic region. There is no evidence of inflammation on imaging, WBC is normal, and negative Murphy's on examination make clinical suspicion for acute cholecystitis low. LFTs and bilirubin are also normal making choledocholithiasis unlikely. Given that she has had these symptoms for >1 weeks without good answer, I would recommend HIDA so we can at least definitively rule in/out her gallbladder as potential source.     - Otherwise, she is without evidence of bowel obstruction, perforation, ischemia, diverticulitis, appendicitis on imaging and examination. She does not warrant any emergent surgical intervention from general surgery standpoint  - Would  recommend NPO for now as to not delay HIDA; If reassuring, okay for diet as tolerated from surgical perspective  - No  indication for Abx at this time  - Monitor abdominal examination  - Pain control prn; antiemetics prn  - Mobilize  - Further management per primary service; we will follow    All of the above findings and recommendations were discussed with the patient and her family, and all of their questions were answered to their expressed satisfaction.  Thank you for the opportunity to participate in this patient's care.   -- Lynden Oxford, PA-C [facility] 08/06/2023, 9:53 AM M-F: 7am - 4pm

## 2023-08-07 ENCOUNTER — Other Ambulatory Visit: Payer: Medicare HMO

## 2023-08-07 DIAGNOSIS — R112 Nausea with vomiting, unspecified: Secondary | ICD-10-CM | POA: Diagnosis not present

## 2023-08-07 MED ORDER — ENOXAPARIN SODIUM 40 MG/0.4ML IJ SOSY: 40.0000 mg | PREFILLED_SYRINGE | INTRAMUSCULAR | Status: DC

## 2023-08-07 MED ORDER — ACETAMINOPHEN 325 MG PO TABS
650.0000 mg | ORAL_TABLET | Freq: Four times a day (QID) | ORAL | Status: DC | PRN
  Administered 2023-08-07: 650 mg via ORAL
  Filled 2023-08-07: qty 2

## 2023-08-07 NOTE — Care Management CC44 (Signed)
Condition Code 44 Documentation Completed  Patient Details  Name: Tracy Nicholson MRN: [id number] Date of Birth: November 04, 1946   Condition Code 44 given:  Yes Patient signature on Condition Code 44 notice:  Yes Documentation of 2 MD's agreement:  Yes Code 44 added to claim:  Yes    Chapman Fitch, RN 08/07/2023, 12:49 PM

## 2023-08-07 NOTE — Hospital Course (Signed)
77yo with h/o HTN and HLD who presented on 9/17 with intractable n/v and abdominal pain.  Recent pelvic US with ovarian cyst and thickened endometrial stripe, planned for outpatient OB/GYN evaluation.  Abdominal CT with cholelithiasis, diverticulosis, and L adnexal cyst.  RUQ with cholelithiasis, HIDA scan negative.  Surgery consulted, no acute surgical needs.

## 2023-08-07 NOTE — Progress Notes (Signed)
Discharge instructions were reviewed with patient. Questions were encourage and answered. Belongings were collected by pt's family.

## 2023-08-07 NOTE — Discharge Summary (Signed)
Physician Discharge Summary   Patient: Tracy Nicholson MRN: [id number] DOB: 11/30/1945  Admit date:     08/06/2023  Discharge date: 08/07/23  Discharge Physician: Jonah Blue   PCP: Marina Goodell, MD   Recommendations at discharge:   Follow up with Dr. Dalbert Garnet; her office will contact you with an appointment Follow up with Dr. Maryjane Hurter in 1-2 weeks  Discharge Diagnoses: Principal Problem:   Intractable nausea and vomiting Active Problems:   Electrolyte abnormality   Hypertension   AKI (acute kidney injury) (HCC)   Calculus of gallbladder without cholecystitis without obstruction    Hospital Course: 77yo with h/o HTN and HLD who presented on 9/17 with intractable n/v and abdominal pain.  Recent pelvic US with ovarian cyst and thickened endometrial stripe, planned for outpatient OB/GYN evaluation.  Abdominal CT with cholelithiasis, diverticulosis, and L adnexal cyst.  RUQ with cholelithiasis, HIDA scan negative.  Surgery consulted, no acute surgical needs.    Assessment and Plan:  Intractable nausea and vomiting, improved Intractable nausea vomiting abdominal pain x 1 week Has had multiple ER evaluations with imaging notable for ovarian cyst, diverticulosis without diverticulitis as well as cholelithiasis Pending outpatient GYN evaluation for thickened endometrium Surgery consulted, has gallstones but no apparent infection and negative HIDA scan so no acute intervention is needed This appears to be related to pelvic pain, as she has a L adnexal cyst and a thickened endometrial stripe on imaging Her symptoms are controlled at this time Will dc to home today Dr. Christeen Douglas has requested a CA-125 (ordered, pending) and her office will arrange for f/u ASAP (usually within 3 days)   AKI (acute kidney injury), resolved Creatinine 1.2 on presentation and clinically dry FeNa 0.64%, c/w pre-renal azotemia IV fluid hydration provided with improvement in creatinine back  to baseline   Hypertension Resume home losartan, Maxzide  HLD Continue rosuastatin, fish oil, ASA   Electrolyte abnormality Sodium 130 as well as potassium of 3 in the setting of intractable nausea and vomiting resolved         Consultants: Surgery   Procedures: None   Antibiotics: None   30 Day Unplanned Readmission Risk Score     Flowsheet Row ED to Hosp-Admission (Current) from 08/06/2023 in [facility]  30 Day Unplanned Readmission Risk Score (%) 8.66 Filed at 08/07/2023 0801           This score is the patient's risk of an unplanned readmission within 30 days of being discharged (0 -100%). The score is based on dignosis, age, lab data, medications, orders, and past utilization.   Low:  0-14.9   Medium: 15-21.9   High: 22-29.9   Extreme: 30 and above              Pain control - [place] Controlled Substance Reporting System database was reviewed. and patient was instructed, not to drive, operate heavy machinery, perform activities at heights, swimming or participation in water activities or provide baby-sitting services while on Pain, Sleep and Anxiety Medications; until their outpatient Physician has advised to do so again. Also recommended to not to take more than prescribed Pain, Sleep and Anxiety Medications.    Disposition: Home Diet recommendation:  Regular diet DISCHARGE MEDICATION: Allergies as of 08/07/2023   No Known Allergies      Medication List     STOP taking these medications    meloxicam 7.5 MG tablet Commonly known as: Mobic       TAKE these  medications    aspirin 81 MG tablet Take 81 mg by mouth daily.   Fish Oil 1000 MG Caps Take by mouth daily.   losartan 25 MG tablet Commonly known as: COZAAR Take 25 mg by mouth daily.   MAGNESIUM CITRATE PO Take by mouth daily.   multivitamin tablet Take 1 tablet by mouth daily.   ondansetron 4 MG disintegrating tablet Commonly known as:  ZOFRAN-ODT Take 4 mg by mouth every 8 (eight) hours as needed for nausea or vomiting.   PRESERVISION AREDS 2 PO Take by mouth daily.   rosuvastatin 20 MG tablet Commonly known as: CRESTOR Take 20 mg by mouth daily.   traMADol 50 MG tablet Commonly known as: Ultram Take 1 tablet (50 mg total) by mouth every 6 (six) hours as needed.   triamterene-hydrochlorothiazide 37.5-25 MG tablet Commonly known as: MAXZIDE-25 Take 1 tablet by mouth daily.        Discharge Exam: Filed Weights   08/06/23 0628 08/06/23 2317  Weight: 54.4 kg 54.4 kg      Subjective: Feeling better other than a headache.  She is anxious about seeing a GYN as soon as possible.   Objective: Vitals:   08/07/23 0521 08/07/23 0814  BP: (!) 151/77 (!) 162/69  Pulse: 72 71  Resp: 18 18  Temp: 98.1 F (36.7 C) 98.3 F (36.8 C)  SpO2: 94% 99%    Intake/Output Summary (Last 24 hours) at 08/07/2023 1303 Last data filed at 08/07/2023 0323 Gross per 24 hour  Intake 1428.29 ml  Output --  Net 1428.29 ml   Filed Weights   08/06/23 0628 08/06/23 2317  Weight: 54.4 kg 54.4 kg    Exam:  General:  Appears calm and comfortable and is in NAD Eyes:  EOMI, normal lids, iris ENT:  grossly normal hearing, lips & tongue, mmm Neck:  no LAD, masses or thyromegaly Cardiovascular:  RRR, no m/r/g. No LE edema.  Respiratory:   CTA bilaterally with no wheezes/rales/rhonchi.  Normal respiratory effort. Abdomen:  soft, NT, ND Skin:  no rash or induration seen on limited exam Musculoskeletal:  grossly normal tone BUE/BLE, good ROM, no bony abnormality Psychiatric:  grossly normal mood and affect, speech fluent and appropriate, AOx3 Neurologic:  CN 2-12 grossly intact, moves all extremities in coordinated fashion, sensation intact  Data Reviewed: I have reviewed the patient's lab results since admission.  Pertinent labs for today include:  BUN 13/Creatinine 0.93/GFR >60; down from 18/1.16/49 on presentation Albumin  3.3 Normal CBC     Condition at discharge: good  The results of significant diagnostics from this hospitalization (including imaging, microbiology, ancillary and laboratory) are listed below for reference.   Imaging Studies: NM Hepatobiliary Liver Func  Result Date: 08/06/2023 CLINICAL DATA:  Right upper quadrant pain EXAM: NUCLEAR MEDICINE HEPATOBILIARY IMAGING TECHNIQUE: Sequential images of the abdomen were obtained out to 60 minutes following intravenous administration of radiopharmaceutical. RADIOPHARMACEUTICALS:  5.33 mCi Tc-26m  Choletec IV COMPARISON:  CTA and ultrasound earlier 08/06/2023 FINDINGS: Prompt uptake and biliary excretion of activity by the liver is seen. Gallbladder activity is visualized, consistent with patency of cystic duct. Biliary activity passes into small bowel, consistent with patent common bile duct. IMPRESSION: No common duct or cystic duct obstruction Electronically Signed   By: Karen Kays M.D.   On: 08/06/2023 17:24   US Abdomen Limited RUQ (LIVER/GB)  Result Date: 08/06/2023 CLINICAL DATA:  Right upper quadrant abdominal pain EXAM: ULTRASOUND ABDOMEN LIMITED RIGHT UPPER QUADRANT COMPARISON:  CTA abdomen  and pelvis dated 08/06/2023 FINDINGS: Gallbladder: No wall thickening visualized. Innumerable punctate stones. No sonographic Murphy sign noted by sonographer. Common bile duct: Diameter: 6 mm Liver: 6 x 6 x 6 mm hypoechoic focus in the peripheral left hepatic lobe, likely cyst. Within normal limits in parenchymal echogenicity. Portal vein is patent on color Doppler imaging with normal direction of blood flow towards the liver. Other: None. IMPRESSION: Cholelithiasis without sonographic evidence of acute cholecystitis. Electronically Signed   By: Agustin Cree M.D.   On: 08/06/2023 10:01   CT Angio Abd/Pel W and/or Wo Contrast  Result Date: 08/06/2023 CLINICAL DATA:  Mesenteric ischemia, acute EXAM: CTA ABDOMEN AND PELVIS WITHOUT AND WITH CONTRAST TECHNIQUE:  Multidetector CT imaging of the abdomen and pelvis was performed using the standard protocol during bolus administration of intravenous contrast. Multiplanar reconstructed images and MIPs were obtained and reviewed to evaluate the vascular anatomy. RADIATION DOSE REDUCTION: This exam was performed according to the departmental dose-optimization program which includes automated exposure control, adjustment of the mA and/or kV according to patient size and/or use of iterative reconstruction technique. CONTRAST:  OMNIPAQUE IOHEXOL 350 MG/ML SOLN COMPARISON:  August 01, 2023 FINDINGS: VASCULAR Aorta: Normal caliber with scattered atherosclerotic plaque. Celiac: Patent without significant stenosis. SMA: Patent without significant stenosis. No filling defects identified in the distal branches. IMA: Patent. Renals: Both renal arteries are patent without evidence of aneurysm, dissection, vasculitis, fibromuscular dysplasia or significant stenosis. Single renal arteries bilaterally. Inflow: Patent without evidence of aneurysm, dissection, vasculitis or significant stenosis. Veins: Portal venous system is patent. Systemic veins are unremarkable. NON-VASCULAR Inferior chest: The lung bases are well-aerated. Hepatobiliary: Liver is normal in size. A couple of small subcentimeter hypodense lesions in the liver are too small to definitively characterize but favored benign and requiring no further imaging workup. No intrahepatic or extrahepatic biliary ductal dilation. Cholelithiasis. Spleen: Normal in size without focal abnormality. Pancreas: No pancreatic ductal dilatation or surrounding inflammatory changes. Adrenals/Urinary Tract: Adrenal glands are unremarkable. Kidneys are normal in size without suspicious mass or hydronephrosis. Bladder is unremarkable. Stomach/Bowel: The stomach, small bowel and large bowel are normal in caliber without abnormal wall thickening or surrounding inflammatory changes. Extensive  sigmoid diverticulosis without active inflammatory changes. Appendix is normal. Reproductive: Left adnexal cystic lesion better characterized on recent pelvic ultrasound, refer to separate report. Otherwise unremarkable. Lymphatic: No enlarged lymph nodes in the abdomen or pelvis. Other: No abdominopelvic ascites. Musculoskeletal: No aggressive osseous lesions. The soft tissues are unremarkable. IMPRESSION: VASCULAR 1. The mesenteric arteries and portal veins are widely patent. No findings of acute or chronic mesenteric ischemia. NON-VASCULAR 1. No acute findings in the abdomen or pelvis. 2. Cholelithiasis. 3. Sigmoid diverticulosis without active inflammatory changes. 4. Left adnexal cystic lesion, better characterized on recent pelvic ultrasound. Refer to separate report. Electronically Signed   By: Olive Bass M.D.   On: 08/06/2023 08:30   US PELVIC COMPLETE WITH TRANSVAGINAL  Result Date: 08/05/2023 CLINICAL DATA:  Pelvic pain.  Follow-up ovarian cyst EXAM: TRANSABDOMINAL AND TRANSVAGINAL ULTRASOUND OF PELVIS TECHNIQUE: Both transabdominal and transvaginal ultrasound examinations of the pelvis were performed. Transabdominal technique was performed for global imaging of the pelvis including uterus, ovaries, adnexal regions, and pelvic cul-de-sac. It was necessary to proceed with endovaginal exam following the transabdominal exam to visualize the ovaries. COMPARISON:  CT 08/01/2023 FINDINGS: Uterus Measurements: 8.7 x 4.1 x 4.6 cm = volume: 87 mL. No fibroids or other mass visualized. Endometrium Thickness: 12 mm. Small amount of fluid purses internal  cystic change within the endometrial canal. Right ovary Not visualized. Left ovary Measurements: 3.0 x 2.5 x 2.8 cm = volume: 11 mL. 2.5 cm left ovarian cyst. Smaller adjacent cyst measuring 1.1 cm in size. Other findings No abnormal free fluid. IMPRESSION: 1. Thickened, heterogeneous appearance of the endometrial stripe. Correlate for any history of abnormal  uterine bleeding as endometrial tissue sampling would then be indicated. 2. Small left ovarian cysts measuring up to 2.5 cm, likely physiologic. No followup imaging recommended. Note: This recommendation does not apply to premenarchal patients or to those with increased risk (genetic, family history, elevated tumor markers or other high-risk factors) of ovarian cancer. Reference: Radiology 2019 Nov; 293(2):359-371. 3. Nonvisualization of the right ovary. Electronically Signed   By: Duanne Guess D.O.   On: 08/05/2023 17:31   CT ABDOMEN PELVIS W CONTRAST  Result Date: 08/01/2023 CLINICAL DATA:  Several day history of abdominal pain, decreased appetite EXAM: CT ABDOMEN AND PELVIS WITH CONTRAST TECHNIQUE: Multidetector CT imaging of the abdomen and pelvis was performed using the standard protocol following bolus administration of intravenous contrast. RADIATION DOSE REDUCTION: This exam was performed according to the departmental dose-optimization program which includes automated exposure control, adjustment of the mA and/or kV according to patient size and/or use of iterative reconstruction technique. CONTRAST:  OMNIPAQUE IOHEXOL 300 MG/ML  SOLN COMPARISON:  None Available. FINDINGS: Lower chest: No focal consolidation or pulmonary nodule in the lung bases. No pleural effusion or pneumothorax demonstrated. Partially imaged heart size is normal. Coronary artery calcifications. Hepatobiliary: Scattered subcentimeter hypodensities, too small to characterize likely cysts. No intra or extrahepatic biliary ductal dilation. Cholelithiasis. Pancreas: No focal lesions or main ductal dilation. Spleen: Normal in size without focal abnormality. Adrenals/Urinary Tract: No adrenal nodules. No suspicious renal mass, calculi or hydronephrosis. No focal bladder wall thickening. Stomach/Bowel: Normal appearance of the stomach. No evidence of bowel wall thickening, distention, or inflammatory changes. Colonic diverticulosis  without acute diverticulitis. Normal appendix. Vascular/Lymphatic: Aortic atherosclerosis. No enlarged abdominal or pelvic lymph nodes. Reproductive: Left adnexal simple-appearing cyst measures 3.0 x 2.3 cm (2:61). Right adnexal mass. Other: No free fluid, fluid collection, or free air. Musculoskeletal: No acute or abnormal lytic or blastic osseous lesions. Multilevel degenerative changes of the partially imaged thoracic and lumbar spine. IMPRESSION: 1. No acute abdominopelvic findings. 2. Cholelithiasis. 3. Colonic diverticulosis without acute diverticulitis. 4. Left adnexal simple-appearing cyst measures 3.0 x 2.3 cm. Recommend nonemergent pelvic ultrasound examination for further characterization. 5.  Aortic Atherosclerosis (ICD10-I70.0). Electronically Signed   By: Agustin Cree M.D.   On: 08/01/2023 14:40    Microbiology: Results for orders placed or performed during the hospital encounter of 08/01/23  SARS Coronavirus 2 by RT PCR (hospital order, performed in [facility] hospital lab) *cepheid single result test* Urine, Clean Catch     Status: None   Collection Time: 08/01/23 12:51 PM   Specimen: Urine, Clean Catch; Nasal Swab  Result Value Ref Range Status   SARS Coronavirus 2 by RT PCR NEGATIVE NEGATIVE Final    Comment: (NOTE) SARS-CoV-2 target nucleic acids are NOT DETECTED.  The SARS-CoV-2 RNA is generally detectable in upper and lower respiratory specimens during the acute phase of infection. The lowest concentration of SARS-CoV-2 viral copies this assay can detect is 250 copies / mL. A negative result does not preclude SARS-CoV-2 infection and should not be used as the sole basis for treatment or other patient management decisions.  A negative result may occur with improper specimen collection / handling,  submission of specimen other than nasopharyngeal swab, presence of viral mutation(s) within the areas targeted by this assay, and inadequate number of viral copies (<250 copies / mL).  A negative result must be combined with clinical observations, patient history, and epidemiological information.  Fact Sheet for Patients:   RoadLapTop.co.za  Fact Sheet for Healthcare Providers: http://kim-miller.com/  This test is not yet approved or  cleared by the [place] FDA and has been authorized for detection and/or diagnosis of SARS-CoV-2 by FDA under an Emergency Use Authorization (EUA).  This EUA will remain in effect (meaning this test can be used) for the duration of the COVID-19 declaration under Section 564(b)(1) of the Act, 21 U.S.C. section 360bbb-3(b)(1), unless the authorization is terminated or revoked sooner.  Performed at [facility], [address] [place], [place] [address]     Labs: CBC: Recent Labs  Lab 08/01/23 1148 08/06/23 0630 08/07/23 0619  WBC 8.9 8.2 5.5  NEUTROABS  --  6.4  --   HGB 13.9 12.7 12.0  HCT 43.1 38.2 36.1  MCV 85.2 81.6 84.0  PLT 388 369 336   Basic Metabolic Panel: Recent Labs  Lab 08/01/23 1148 08/06/23 0630 08/06/23 1705 08/07/23 0619  NA 133* 130* 138 140  K 3.4* 3.0* 3.6 3.7  CL 96* 97* 103 109  CO2 22 23 25 23   GLUCOSE 111* 124* 94 97  BUN 20 18 13 13   CREATININE 1.08* 1.16* 1.01* 0.93  CALCIUM 9.7 9.9 9.5 9.3  MG  --  2.0  --   --    Liver Function Tests: Recent Labs  Lab 08/01/23 1148 08/06/23 0630 08/07/23 0619  AST 31 26 22   ALT 17 15 15   ALKPHOS 72 58 54  BILITOT 1.3* 0.9 1.0  PROT 8.2* 7.0 6.2*  ALBUMIN 4.3 3.7 3.3*   CBG: No results for input(s): "GLUCAP" in the last 168 hours.  Discharge time spent: greater than 30 minutes.  Signed: Jonah Blue, MD [facility] 08/07/2023

## 2023-08-07 NOTE — TOC CM/SW Note (Signed)
Transition of Care [facility]) - Inpatient Brief Assessment   Patient Details  Name: Tracy Nicholson MRN: [id number] Date of Birth: [birth year]  Transition of Care [facility]) CM/SW Contact:    Chapman Fitch, RN Phone Number: 08/07/2023, 1:58 PM   Clinical Narrative:   Transition of Care [facility]) Screening Note   Patient Details  Name: Tracy Nicholson Date of Birth: Apr 10, 1946   Transition of Care [facility]) CM/SW Contact:    Chapman Fitch, RN Phone Number: 08/07/2023, 1:58 PM    Transition of Care Department [facility]) has reviewed patient and no TOC needs have been identified at this time. We will continue to monitor patient advancement through interdisciplinary progression rounds. If new patient transition needs arise, please place a TOC consult.    Transition of Care Asessment: Insurance and Status: Insurance coverage has been reviewed Patient has primary care physician: Yes     Prior/Current Home Services: No current home services Social Determinants of Health Reivew: SDOH reviewed no interventions necessary Readmission risk has been reviewed: Yes Transition of care needs: no transition of care needs at this time

## 2023-08-07 NOTE — Plan of Care (Signed)

## 2023-08-07 NOTE — Care Management Obs Status (Signed)
MEDICARE OBSERVATION STATUS NOTIFICATION   Patient Details  Name: Tracy Nicholson MRN: [id number] Date of Birth: September 17, 1946   Medicare Observation Status Notification Given:  Yes    Chapman Fitch, RN 08/07/2023, 12:49 PM

## 2023-08-08 LAB — CA 125: Cancer Antigen (CA) 125: 8.5 U/mL (ref 0.0–38.1)

## 2023-08-09 DIAGNOSIS — R102 Pelvic and perineal pain: Secondary | ICD-10-CM | POA: Diagnosis not present

## 2023-08-09 DIAGNOSIS — R9389 Abnormal findings on diagnostic imaging of other specified body structures: Secondary | ICD-10-CM | POA: Diagnosis not present

## 2023-08-09 DIAGNOSIS — R112 Nausea with vomiting, unspecified: Secondary | ICD-10-CM | POA: Diagnosis not present

## 2023-08-09 DIAGNOSIS — R10819 Abdominal tenderness, unspecified site: Secondary | ICD-10-CM | POA: Diagnosis not present

## 2023-08-09 DIAGNOSIS — K5730 Diverticulosis of large intestine without perforation or abscess without bleeding: Secondary | ICD-10-CM | POA: Diagnosis not present

## 2023-08-09 DIAGNOSIS — Z124 Encounter for screening for malignant neoplasm of cervix: Secondary | ICD-10-CM | POA: Diagnosis not present

## 2023-08-09 DIAGNOSIS — N882 Stricture and stenosis of cervix uteri: Secondary | ICD-10-CM | POA: Diagnosis not present

## 2023-08-13 MED ORDER — POVIDONE-IODINE 10 % EX SWAB
2.0000 | Freq: Once | CUTANEOUS | Status: AC
  Administered 2023-08-14: 2 via TOPICAL

## 2023-08-13 MED ORDER — LACTATED RINGERS IV SOLN: INTRAVENOUS | Status: DC

## 2023-08-13 MED ORDER — ACETAMINOPHEN 500 MG PO TABS
1000.0000 mg | ORAL_TABLET | ORAL | Status: AC
  Administered 2023-08-14: 1000 mg via ORAL

## 2023-08-13 MED ORDER — CELECOXIB 200 MG PO CAPS
400.0000 mg | ORAL_CAPSULE | ORAL | Status: AC
  Administered 2023-08-14: 400 mg via ORAL

## 2023-08-13 MED ORDER — GABAPENTIN 300 MG PO CAPS
300.0000 mg | ORAL_CAPSULE | ORAL | Status: AC
  Administered 2023-08-14: 300 mg via ORAL

## 2023-08-13 NOTE — H&P (Signed)
Tracy Nicholson is a 77 y.o. female here for ED follow up (New pt - ED follow up pelvic pain, cyst of ovary, u/s and ct done in ED)   Referring provider: Christy Gentles*   History of Present Illness: New pt presents for ED follow up. She presented to the ED 08/06/23 for nausea, vomiting, dehydration, inability to tolerate food and pelvic pain. She had previous imaging done on the 16th that revealed thickened endometrium and LO cysts. She denies vaginal bleeding.    CT showed colonic diverticulitis, without acute diverticulitis.    9/18: CA125 8.5    Today: She has been experiencing some low abdominal pressure and intermittent pain for years now. Lately, it was been more uncomfortable than normal and nausea was worsening.      TVUS 08/05/23 Uterus 8.7 x 4.1 x 4.6 cm  Endometrium 12 mm  RO not seen  LO with cysts, largest 2.5 cm    1. Thickened, heterogeneous appearance of the endometrial stripe. Correlate for any history of abnormal uterine bleeding as endometrial tissue sampling would then be indicated.  2. Small left ovarian cysts measuring up to 2.5 cm, likely physiologic. No followup imaging recommended.  3. Nonvisualization of the right ovary.      Past Medical History:  has a past medical history of Arthritis, Diverticulitis, Hyperlipidemia, and Hypertension.  Past Surgical History:  has no past surgical history on file. Family History: family history is not on file. Social History:  reports that she has never smoked. She has never used smokeless tobacco. She reports that she does not drink alcohol and does not use drugs. OB/GYN History:  OB History       Gravida 5   Para 5   Term 5   Preterm     AB     Living 5       SAB     IAB     Ectopic     Molar     Multiple     Live Births          Allergies: has No Known Allergies. Medications: Current Medications Current Outpatient Medications:    acetaminophen (TYLENOL EXTRA STRENGTH  ORAL), Take by mouth, Disp: , Rfl:    aspirin 81 MG EC tablet, Take 81 mg by mouth once daily., Disp: , Rfl:    losartan (COZAAR) 25 MG tablet, TAKE 1 TABLET(25 MG) BY MOUTH EVERY DAY, Disp: 90 tablet, Rfl: 1   meloxicam (MOBIC) 7.5 MG tablet, TAKE 1 TABLET(7.5 MG) BY MOUTH EVERY DAY, Disp: 90 tablet, Rfl: 0   multivitamin tablet, Take 1 tablet by mouth once daily, Disp: , Rfl:    omega-3 fatty acids/fish oil 340-1,000 mg capsule, Take 1 capsule by mouth once daily., Disp: , Rfl:    ondansetron (ZOFRAN-ODT) 4 MG disintegrating tablet, Take 4 mg by mouth every 8 (eight) hours as needed, Disp: , Rfl:    rosuvastatin (CRESTOR) 20 MG tablet, take 1 tablet by mouth every day, Disp: 90 tablet, Rfl: 1   traMADoL (ULTRAM) 50 mg tablet, Take 50 mg by mouth every 6 (six) hours as needed, Disp: , Rfl:    triamterene-hydroCHLOROthiazide (MAXZIDE-25) 37.5-25 mg tablet, TAKE 1 TABLET BY MOUTH EVERY DAY, Disp: 90 tablet, Rfl: 3   vit C/E/Zn/coppr/lutein/zeaxan (PRESERVISION AREDS-2 ORAL), Take by mouth, Disp: , Rfl:    amoxicillin-clavulanate (AUGMENTIN) 875-125 mg tablet, Take 1 tablet (875 mg total) by mouth every 12 (twelve) hours for 10 days, Disp: 20 tablet, Rfl: 0  aspirin 81 MG EC tablet, Take 81 mg by mouth once daily, Disp: , Rfl:     Review of Systems: No SOB, no palpitations or chest pain, no new lower extremity edema, no nausea or vomiting or bowel or bladder complaints. See HPI for gyn specific ROS.    Exam:   BP (!) 166/81   Pulse 81   Ht 142.2 cm (4\' 8" )   Wt 53.8 kg (118 lb 9.6 oz)   BMI 26.59 kg/m     Constitutional:  General appearance: Well nourished, well developed female in no acute distress.  Neuro/psych:  Normal mood and affect. No gross motor deficits. Neck:  Supple, normal appearance.  Respiratory:  Normal respiratory effort, no use of accessory muscles Skin:  No visible rashes or external lesions       No pubic symphyasitis, pain able to be triggered by palpation to  central line between top of mons and right below umbilicus. Also pain she has been feeling was triggered by EMBx.  Bilateral pudendal tenderness.   Pelvic:              External genitalia: vulva /labia no lesions, Tanner stage 5             Urethra: no prolapse             Vagina: normal physiologic d/c             Cervix: no lesions, no cervical motion tenderness               Uterus: normal size shape and contour, exquisitely tender             Adnexa: no mass,  non-tender     Pap collected today   Endometrial biopsy: The cervix was cleaned with betadine, topical Hurriciane spray applied, and a single tooth tenaculum is applied to the anterior cervix. Cervix required dilation. The Pipelle catheter was placed into the endometrial cavity. It sounds to 7 cm and adequate tissue was removed.  Scant tissue No vaginal discharge noted A female chaperone was present for the more sensitive portions of the physical exam ( such as breast and pelvic)   Impression:   The primary encounter diagnosis was Thickened endometrium. Diagnoses of Diverticulosis of colon, Nausea and vomiting, unspecified vomiting type, Screening for cervical cancer, Cervical stenosis (uterine cervix), Pelvic pain in female, and Suprapubic tenderness were also pertinent to this visit.   Plan:   - Thickened endometrium, pelvic pain and cramping  - EMBx and pap smear collected today  - Scant tissue collected, pt cervix was stenotic and required dilation which was uncomfortable for her. She recovered well, but d/t pt discomfort did not attempt to collect more tissue  - Will get saline ultrasound to further evaluate her lining and again visualize her LO cyst. Though, no ascites and CA125 wnl, low concern for ovarian malignancy at this time.    -Will tx empirically with Augmentin for endometritis, though my suspicion is low. However, Ddx includes pyosalpinx. Wet prep collected today.    - We discussed OR for D&C with hysteroscopy  or Saline in the office. However, pt was significantly uncomfortable today with EMBx and would need sufficient pain medication and paracervical block.    - Pt will continue with D&C with hysteroscopy.  - Return for postop    - Will repeat ultrasound in 6 months to monitor her cyst if clinically appropriate after full management.   - Nausea, Vomiting  - We discussed  referral to GI for her sx and the colonic diverticulosis seen on CT.

## 2023-08-14 ENCOUNTER — Encounter: Payer: Self-pay | Admitting: Obstetrics and Gynecology

## 2023-08-14 ENCOUNTER — Ambulatory Visit
Admission: RE | Admit: 2023-08-14 | Discharge: 2023-08-14 | Disposition: A | Discharge status: Home / Self Care | Payer: Medicare HMO | Source: Ambulatory Visit | Attending: Obstetrics and Gynecology | Admitting: Obstetrics and Gynecology

## 2023-08-14 ENCOUNTER — Encounter
Admission: RE | Disposition: A | Discharge status: Home / Self Care | Payer: Self-pay | Source: Ambulatory Visit | Attending: Obstetrics and Gynecology

## 2023-08-14 ENCOUNTER — Other Ambulatory Visit: Payer: Self-pay

## 2023-08-14 ENCOUNTER — Ambulatory Visit: Payer: Medicare HMO | Admitting: Certified Registered"

## 2023-08-14 DIAGNOSIS — N8500 Endometrial hyperplasia, unspecified: Secondary | ICD-10-CM | POA: Diagnosis not present

## 2023-08-14 DIAGNOSIS — R102 Pelvic and perineal pain: Secondary | ICD-10-CM

## 2023-08-14 DIAGNOSIS — N9489 Other specified conditions associated with female genital organs and menstrual cycle: Secondary | ICD-10-CM | POA: Insufficient documentation

## 2023-08-14 DIAGNOSIS — N879 Dysplasia of cervix uteri, unspecified: Secondary | ICD-10-CM | POA: Diagnosis not present

## 2023-08-14 DIAGNOSIS — Z419 Encounter for procedure for purposes other than remedying health state, unspecified: Secondary | ICD-10-CM

## 2023-08-14 DIAGNOSIS — R112 Nausea with vomiting, unspecified: Secondary | ICD-10-CM | POA: Diagnosis not present

## 2023-08-14 DIAGNOSIS — N72 Inflammatory disease of cervix uteri: Secondary | ICD-10-CM | POA: Diagnosis not present

## 2023-08-14 DIAGNOSIS — I10 Essential (primary) hypertension: Secondary | ICD-10-CM | POA: Insufficient documentation

## 2023-08-14 DIAGNOSIS — N840 Polyp of corpus uteri: Secondary | ICD-10-CM | POA: Diagnosis not present

## 2023-08-14 DIAGNOSIS — E785 Hyperlipidemia, unspecified: Secondary | ICD-10-CM | POA: Diagnosis not present

## 2023-08-14 DIAGNOSIS — K5730 Diverticulosis of large intestine without perforation or abscess without bleeding: Secondary | ICD-10-CM | POA: Diagnosis not present

## 2023-08-14 HISTORY — PX: HYSTEROSCOPY WITH D & C: SHX1775

## 2023-08-14 LAB — CBC
HCT: 40.4 % (ref 36.0–46.0)
Hemoglobin: 13.5 g/dL (ref 12.0–15.0)
MCH: 27.5 pg (ref 26.0–34.0)
MCHC: 33.4 g/dL (ref 30.0–36.0)
MCV: 82.3 fL (ref 80.0–100.0)
Platelets: 400 10*3/uL (ref 150–400)
RBC: 4.91 MIL/uL (ref 3.87–5.11)
RDW: 13.5 % (ref 11.5–15.5)
WBC: 8.4 10*3/uL (ref 4.0–10.5)
nRBC: 0 % (ref 0.0–0.2)

## 2023-08-14 SURGERY — DILATATION AND CURETTAGE /HYSTEROSCOPY
Anesthesia: General

## 2023-08-14 MED ORDER — FENTANYL CITRATE (PF) 100 MCG/2ML IJ SOLN
INTRAMUSCULAR | Status: AC
  Filled 2023-08-14: qty 2

## 2023-08-14 MED ORDER — DROPERIDOL 2.5 MG/ML IJ SOLN: 0.6250 mg | Freq: Once | INTRAMUSCULAR | Status: DC | PRN

## 2023-08-14 MED ORDER — PROPOFOL 10 MG/ML IV BOLUS
INTRAVENOUS | Status: DC | PRN
  Administered 2023-08-14: 20 mg via INTRAVENOUS
  Administered 2023-08-14: 40 mg via INTRAVENOUS

## 2023-08-14 MED ORDER — GABAPENTIN 300 MG PO CAPS
ORAL_CAPSULE | ORAL | Status: AC
  Filled 2023-08-14: qty 1

## 2023-08-14 MED ORDER — PHENYLEPHRINE 80 MCG/ML (10ML) SYRINGE FOR IV PUSH (FOR BLOOD PRESSURE SUPPORT)
PREFILLED_SYRINGE | INTRAVENOUS | Status: DC | PRN
  Administered 2023-08-14 (×2): 80 ug via INTRAVENOUS

## 2023-08-14 MED ORDER — PROPOFOL 1000 MG/100ML IV EMUL
INTRAVENOUS | Status: AC
  Filled 2023-08-14: qty 100

## 2023-08-14 MED ORDER — SILVER NITRATE-POT NITRATE 75-25 % EX MISC
CUTANEOUS | Status: AC
  Filled 2023-08-14: qty 10

## 2023-08-14 MED ORDER — DEXMEDETOMIDINE HCL IN NACL 200 MCG/50ML IV SOLN
INTRAVENOUS | Status: DC | PRN
Start: 2023-08-14 — End: 2023-08-14
  Administered 2023-08-14: 8 ug via INTRAVENOUS

## 2023-08-14 MED ORDER — ONDANSETRON HCL 4 MG/2ML IJ SOLN
INTRAMUSCULAR | Status: DC | PRN
  Administered 2023-08-14 (×2): 4 mg via INTRAVENOUS

## 2023-08-14 MED ORDER — ACETAMINOPHEN 500 MG PO TABS
ORAL_TABLET | ORAL | Status: AC
  Filled 2023-08-14: qty 2

## 2023-08-14 MED ORDER — LACTATED RINGERS IV SOLN: INTRAVENOUS | Status: DC

## 2023-08-14 MED ORDER — CELECOXIB 200 MG PO CAPS
ORAL_CAPSULE | ORAL | Status: AC
  Filled 2023-08-14: qty 2

## 2023-08-14 MED ORDER — DEXAMETHASONE SODIUM PHOSPHATE 10 MG/ML IJ SOLN
INTRAMUSCULAR | Status: DC | PRN
  Administered 2023-08-14: 10 mg via INTRAVENOUS

## 2023-08-14 MED ORDER — CHLORHEXIDINE GLUCONATE 0.12 % MT SOLN
OROMUCOSAL | Status: AC
  Filled 2023-08-14: qty 15

## 2023-08-14 MED ORDER — FENTANYL CITRATE (PF) 100 MCG/2ML IJ SOLN
25.0000 ug | INTRAMUSCULAR | Status: DC | PRN
  Administered 2023-08-14 (×2): 25 ug via INTRAVENOUS

## 2023-08-14 MED ORDER — ORAL CARE MOUTH RINSE: 15.0000 mL | Freq: Once | OROMUCOSAL | Status: AC

## 2023-08-14 MED ORDER — PROPOFOL 500 MG/50ML IV EMUL
INTRAVENOUS | Status: DC | PRN
  Administered 2023-08-14: 145 ug/kg/min via INTRAVENOUS

## 2023-08-14 MED ORDER — OXYCODONE HCL 5 MG PO TABS
5.0000 mg | ORAL_TABLET | Freq: Once | ORAL | Status: AC
  Administered 2023-08-14: 5 mg via ORAL

## 2023-08-14 MED ORDER — FENTANYL CITRATE (PF) 100 MCG/2ML IJ SOLN
INTRAMUSCULAR | Status: DC | PRN
  Administered 2023-08-14: 15 ug via INTRAVENOUS
  Administered 2023-08-14 (×2): 25 ug via INTRAVENOUS
  Administered 2023-08-14 (×2): 10 ug via INTRAVENOUS
  Administered 2023-08-14: 15 ug via INTRAVENOUS

## 2023-08-14 MED ORDER — MIDAZOLAM HCL 2 MG/2ML IJ SOLN
INTRAMUSCULAR | Status: AC
  Filled 2023-08-14: qty 2

## 2023-08-14 MED ORDER — OXYCODONE HCL 5 MG PO TABS
ORAL_TABLET | ORAL | Status: AC
  Filled 2023-08-14: qty 1

## 2023-08-14 MED ORDER — GLYCOPYRROLATE 0.2 MG/ML IJ SOLN
INTRAMUSCULAR | Status: DC | PRN
Start: 2023-08-14 — End: 2023-08-14
  Administered 2023-08-14: .2 mg via INTRAVENOUS

## 2023-08-14 MED ORDER — CHLORHEXIDINE GLUCONATE 0.12 % MT SOLN
15.0000 mL | Freq: Once | OROMUCOSAL | Status: AC
  Administered 2023-08-14: 15 mL via OROMUCOSAL

## 2023-08-14 SURGICAL SUPPLY — 20 items
BAG PRESSURE INF REUSE 1000 (BAG) ×1 IMPLANT
BASIN KIT SINGLE STR (MISCELLANEOUS) ×1 IMPLANT
DEVICE MYOSURE REACH (MISCELLANEOUS) IMPLANT
DRSG TELFA 3X8 NADH STRL (GAUZE/BANDAGES/DRESSINGS) IMPLANT
ELECT REM PT RETURN 9FT ADLT (ELECTROSURGICAL) ×1
ELECTRODE REM PT RTRN 9FT ADLT (ELECTROSURGICAL) ×1 IMPLANT
GLOVE BIO SURGEON STRL SZ7 (GLOVE) ×1 IMPLANT
GLOVE INDICATOR 7.5 STRL GRN (GLOVE) ×1 IMPLANT
GOWN STRL REUS W/ TWL LRG LVL3 (GOWN DISPOSABLE) ×2 IMPLANT
GOWN STRL REUS W/TWL LRG LVL3 (GOWN DISPOSABLE) ×2
IV NS IRRIG 3000ML ARTHROMATIC (IV SOLUTION) ×1 IMPLANT
KIT PROCEDURE FLUENT (KITS) ×1 IMPLANT
KIT TURNOVER CYSTO (KITS) ×1 IMPLANT
MANIFOLD NEPTUNE II (INSTRUMENTS) ×1 IMPLANT
PACK DNC HYST (MISCELLANEOUS) ×1 IMPLANT
PAD PREP OB/GYN DISP 24X41 (PERSONAL CARE ITEMS) ×1 IMPLANT
SCRUB CHG 4% DYNA-HEX 4OZ (MISCELLANEOUS) ×1 IMPLANT
SEAL ROD LENS SCOPE MYOSURE (ABLATOR) IMPLANT
TRAP FLUID SMOKE EVACUATOR (MISCELLANEOUS) ×1 IMPLANT
WATER STERILE IRR 500ML POUR (IV SOLUTION) ×1 IMPLANT

## 2023-08-14 NOTE — Interval H&P Note (Signed)
History and Physical Interval Note:  08/14/2023 11:28 AM  Tracy Nicholson  has presented today for surgery, with the diagnosis of Thickened endo stripe, pelvic pain.  The various methods of treatment have been discussed with the patient and family. After consideration of risks, benefits and other options for treatment, the patient has consented to  Procedure(s): DILATATION AND CURETTAGE /HYSTEROSCOPY (N/A) as a surgical intervention.  The patient's history has been reviewed, patient examined, no change in status, stable for surgery.  I have reviewed the patient's chart and labs.  Questions were answered to the patient's satisfaction.     Christeen Douglas

## 2023-08-14 NOTE — Anesthesia Procedure Notes (Signed)
Procedure Name: General with mask airway Date/Time: 08/14/2023 12:24 PM  Performed by: Mohammed Kindle, CRNAPre-anesthesia Checklist: Patient identified, Emergency Drugs available, Suction available and Patient being monitored Patient Re-evaluated:Patient Re-evaluated prior to induction Oxygen Delivery Method: Simple face mask Induction Type: IV induction Placement Confirmation: positive ETCO2, CO2 detector and breath sounds checked- equal and bilateral Dental Injury: Teeth and Oropharynx as per pre-operative assessment

## 2023-08-14 NOTE — Op Note (Signed)
Operative Report Hysteroscopy with Dilation and Curettage   Indications: Uterine pain   Pre-operative Diagnosis: Thickened endometrial stripe with significant and exquisite cervical and uterine tenderness  Post-operative Diagnosis: same.  Procedure: 1. Exam under anesthesia 2. Fractional D&C 3. Hysteroscopy 4. Myosure polypectomy  Surgeon: Christeen Douglas, MD  Assistant(s):  None  Anesthesia: General LMA anesthesia  Anesthesiologist: Lenard Simmer, MD Anesthesiologist: Lenard Simmer, MD CRNA: Mohammed Kindle, CRNA  Estimated Blood Loss:  Minimal         Total IV Fluids:  Urine Output: 25ml  Total Fluid Deficit:  55 mL          Specimens: Endocervical curettings, endometrial curettings, endometrial polyp         Complications:  None; patient tolerated the procedure well.         Disposition: PACU - hemodynamically stable.         Condition: stable  Findings: Uterus measuring 7 cm by sound; normal cervix, vagina, perineum.  Diffusely atrophic endometrium with 2 endometrial polyps noted on the right lateral wall and the left anterior.  Normal tubal ostia.  Indication for procedure/Consents: 77 y.o. F  here for scheduled surgery for the aforementioned diagnoses.   Risks of surgery were discussed with the patient including but not limited to: bleeding which may require transfusion; infection which may require antibiotics; injury to uterus or surrounding organs; intrauterine scarring which may impair future fertility; need for additional procedures including laparotomy or laparoscopy; and other postoperative/anesthesia complications. Written informed consent was obtained.    Procedure Details:    D&C/ Myosure  The patient was taken to the operating room where anesthesia was administered and was found to be adequate.  After a formal and adequate timeout was performed, she was placed in the dorsal lithotomy position and examined with the above findings. She  was then prepped and draped in the sterile manner.   Her bladder was catheterized for an estimated amount of clear, yellow urine. A weighed speculum was then placed in the patient's vagina and a single tooth tenaculum was applied to the anterior lip of the cervix.  Her cervix was serially dilated to 15 [place] using Hanks dilators.  An ECC was performed. The hysteroscope was introduced under direct observation  Using lactated ringers as a distention medium to reveal the above findings. The uterine cavity was carefully examined, both ostia were recognized, and diffusely atrophic endometrium with the polyp above were noted.   This was resected using the Myosure device.  After further careful visualization of the uterine cavity, the hysteroscope was removed under direct visualization.  A sharp curettage was then performed until there was a gritty texture in all four quadrants.  The tenaculum was removed from the anterior lip of the cervix and the vaginal speculum was removed after applying silver nitrate for good hemostasis.   The patient tolerated the procedure well and was taken to the recovery area awake and in stable condition. She received iv acetaminophen and Toradol prior to leaving the OR.  The patient will be discharged to home as per PACU criteria. Routine postoperative instructions given.  She was prescribed Ibuprofen and Colace.  She will follow up in the clinic in two weeks for postoperative evaluation.

## 2023-08-14 NOTE — Discharge Instructions (Addendum)
Discharge instructions after a hysteroscopy with dilation and curettage  Signs and Symptoms to Report  Call our office at [phone] if you have any of the following:    Fever over 100.4 degrees or higher  Severe stomach pain not relieved with pain medications  Bright red bleeding that's heavier than a period that does not slow with rest after the first 24 hours  To go the bathroom a lot (frequency), you can't hold your urine (urgency), or it hurts when you empty your bladder (urinate)  Chest pain  Shortness of breath  Pain in the calves of your legs  Severe nausea and vomiting not relieved with anti-nausea medications  Any concerns  What You Can Expect after Surgery  You may see some pink tinged, bloody fluid. This is normal. You may also have cramping for several days.   Activities after Your Discharge Follow these guidelines to help speed your recovery at home:  Don't drive if you are in pain or taking narcotic pain medicine. You may drive when you can safely slam on the brakes, turn the wheel forcefully, and rotate your torso comfortably. This is typically 4-7 days. Practice in a parking lot or side street prior to attempting to drive regularly.   Ask others to help with household chores for 4 weeks.  Don't do strenuous activities, exercises, or sports like vacuuming, tennis, squash, etc. until your doctor says it is safe to do so.  Walk as you feel able. Rest often since it may take a week or two for your energy level to return to normal.   You may climb stairs  Avoid constipation:   -Eat fruits, vegetables, and whole grains. Eat small meals as your appetite will take time to return to normal.   -Drink 6 to 8 glasses of water each day unless your doctor has told you to limit your fluids.   -Use a laxative or stool softener as needed if constipation becomes a problem. You may take Miralax, metamucil, Citrucil, Colace, Senekot, FiberCon, etc. If this does not relieve the  constipation, try two tablespoons of Milk Of Magnesia every 8 hours until your bowels move.   You may shower.   Do not get in a hot tub, swimming pool, etc. until your doctor agrees.  Do not douche, use tampons, or have sex until your doctor says it is okay, usually about 2 weeks.  Take your pain medicine when you need it. The medicine may not work as well if the pain is bad.  Take the medicines you were taking before surgery. Other medications you might need are pain medications (ibuprofen), medications for constipation (Colace) and nausea medications (Zofran).   Here is a helpful article from the website http://mitchell.org/, regarding constipation  Here are reasons why constipation occurs after surgery: 1) During the operation and in the recovery room, most people are given opioid pain medication, primarily through an IV, to treat moderate or severe pain. Intravenous opioids include morphine, Dilaudid and fentanyl. After surgery, patients are often prescribed opioid pain medication to take by mouth at home, including codeine, Vicodin, Norco, and Percocet. All of these medications cause constipation by slowing down the movement of your intestine. 2) Changes in your diet before surgery can be another culprit. It is common to get specific instructions to change how you normally eat or drink before your surgery, like only having liquids the day before or not having anything to eat or drink after midnight the night before surgery. For this  reason, temporary dehydration may occur. This, along with not eating or only having liquids, means that you are getting less fiber than usual. Both these factors contribute to constipation. 3) Changes in your diet after surgery can also contribute to the problem. Although many people don't have dietary restrictions after operations, being under anesthesia can make you lose your appetite for several hours and maybe even days. Some people can even have nausea or vomiting. Not  eating or drinking normally means that you are not getting enough fiber and you can get dehydrated, both leading to constipation. 4) Lying in a bed more than usual--which happens before, during and after surgery--combined with the medications and diet changes, all work together to slow down your colon and make your poop turn to rock.  No one likes to be constipated.  Let's face it, it's not a pleasant feeling when you don't poop for days, then strain on the toilet to finally pass something large enough to cause damage. An ounce of prevention is worth a pound of cure, so: Assume you will be constipated. Plan and prepare accordingly. Post-surgery is one of those unique situations where the temporary use of laxatives can make a world of difference. Always consult with your doctor, and recognize that if you wait several days after surgery to take a laxative, the constipation might be too severe for these over-the-counter options. It is always important to discuss all medications you plan on taking with your doctor. Ask your doctor if you can start the laxative immediately after surgery. *  Here are go-to post-surgery laxatives: Senna: Senna is an herb that acts as a "stimulant laxative," meaning it increases the activity of the intestine to cause you to have a bowel movement. It comes in many forms, but senna pills are easy to take and are sold over the counter at almost all pharmacies. Since opioid pain medications slow down the activity of the intestine, it makes sense to take a medication to help reverse that side effect. Long-term use of a stimulant laxative is not a good idea since it can make your colon "lazy" and not function properly; however, temporary use immediately after surgery is acceptable. In general, if you are able to eat a normal diet, taking senna soon after surgery works the best. Senna usually works within hours to produce a bowel movement, but this is less predictable when you are taking  different medications after surgery. Try not to wait several days to start taking senna, as often it is too late by then. Just like with all medications or supplements, check with your doctor before starting new treatment.   Magnesium: Magnesium is an important mineral that our body needs. We get magnesium from some foods that we eat, especially foods that are high in fiber such as broccoli, almonds and whole grains. There are also magnesium-based medications used to treat constipation including milk of magnesia (magnesium hydroxide), magnesium citrate and magnesium oxide. They work by drawing water into the intestine, putting it into the class of "osmotic" laxatives. Magnesium products in low doses appear to be safe, but if taken in very large doses, can lead to problems such as irregular heartbeat, low blood pressure and even death. It can also affect other medications you might be taking, therefore it is important to discuss using magnesium with your physician and pharmacist before initiating therapy. Most over-the-counter magnesium laxatives work very well to help with the constipation related to surgery, but sometimes they work too well and lead  to diarrhea. Make sure you are somewhere with easy access to a bathroom, just in case.   Bisacodyl: Bisacodyl (generic name) is sold under brand names such as Dulcolax. Much like senna, it is a "stimulant laxative," meaning it makes your intestines move more quickly to push out the stool. This is another good choice to start taking as soon as your doctor says you can take a laxative after surgery. It comes in pill form and as a suppository, which is a good choice for people who cannot or are not allowed to swallow pills. Studies have shown that it works as a laxative, but like most of these medications, you should use this on a short-term basis only.   Enema: Enemas strike fear in many people, but FEAR NOT! It's nowhere near as big a deal as you may think. An  enema is just a way to get some liquid into your rectum by placing a specially designed device through your anus. If you have never done one, it might seem like a painful, unpleasant, uncomfortable, complicated and lengthy procedure. But in reality, it's simple, takes just a few seconds and is highly effective. The small ready-made bottles you buy at the pharmacy are much easier than the hose/large rubber container type. Those recommended positions illustrated in some instructions are generally not necessary to place the enema. It's very similar to the insertion of a tampon, requiring a slight squat. Some extra lubrication on the enema's tip (or on your anus) will make it a breeze. In certain cases, there is no substitute for a good enema. For example, if someone has not pooped for a few days, the beginning of the poop waiting to come out can become rock hard. Passing that hard stool can lead to much pain and problems like anal fissures. Inserting a little liquid to break up the rock-hard stool will help make its passage much easier. Enemas come with different liquids. Most come with saline, but there are also mineral oil options. You can also use warm water in the reusable enema containers. They all work. But since saline can sometimes be irritating, so try a mineral oil or water enema instead.  Here are commonly recommended constipation medications that do not work well for post-surgery constipation: Docusate: Docusate (generic name) most commonly referred to as Colace (brand name) is not really a laxative, but is classified as a stool softener. Although this medication is commonly prescribed, it is not recommended for several reasons: 1) there is no good medical evidence that it works 2) even if it has an effect, which is very questionable, it is minimal and cannot combat the intestinal slowing caused by the opioid medications. Skip docusate to save money and space in your pillbox for something more  effective.  PEG: Miralax (brand name) is basically a chemical called polyethylene glycol (PEG) and it has gained tremendous popularity as a laxative. This product is an "osmotic laxative" meaning it works by pulling water into the stool, making it softer. This is very similar to the action of natural fiber in foods and supplements. Therefore, the effect seen by this medication is not immediate, causing a bowel movement in a day or more. Is this medication strong enough to battle the constipation related to having an operation? Maybe for some people not prone to constipation. But for most people, other laxatives are better to prevent constipation after surgery.  AMBULATORY SURGERY  DISCHARGE INSTRUCTIONS   The drugs that you were given will stay in  your system until tomorrow so for the next 24 hours you should not:  Drive an automobile Make any legal decisions Drink any alcoholic beverage   You may resume regular meals tomorrow.  Today it is better to start with liquids and gradually work up to solid foods.  You may eat anything you prefer, but it is better to start with liquids, then soup and crackers, and gradually work up to solid foods.   Please notify your doctor immediately if you have any unusual bleeding, trouble breathing, redness and pain at the surgery site, drainage, fever, or pain not relieved by medication.    Additional Instructions:   Please contact your physician with any problems or Same Day Surgery at [phone], Monday through Friday 6 am to 4 pm, or [place] at [facility] number at [phone].

## 2023-08-14 NOTE — Transfer of Care (Signed)
Immediate Anesthesia Transfer of Care Note  Patient: Tracy Nicholson  Procedure(s) Performed: DILATATION AND CURETTAGE /HYSTEROSCOPY  Patient Location: PACU  Anesthesia Type:General  Level of Consciousness: drowsy and patient cooperative  Airway & Oxygen Therapy: Patient Spontanous Breathing and Patient connected to face mask oxygen  Post-op Assessment: Report given to RN and Post -op Vital signs reviewed and stable  Post vital signs: Reviewed and stable  Last Vitals:  Vitals Value Taken Time  BP 97/56 08/14/23 1309  Temp    Pulse 66 08/14/23 1311  Resp 14 08/14/23 1311  SpO2 97 % 08/14/23 1311  Vitals shown include unfiled device data.  Last Pain:  Vitals:   08/14/23 1012  TempSrc: Temporal  PainSc: 3          Complications: No notable events documented.

## 2023-08-14 NOTE — Anesthesia Preprocedure Evaluation (Signed)
Anesthesia Evaluation  Patient identified by MRN, date of birth, ID band Patient awake    Reviewed: Allergy & Precautions, NPO status , Patient's Chart, lab work & pertinent test results  History of Anesthesia Complications Negative for: history of anesthetic complications  Airway Mallampati: II  TM Distance: >3 FB Neck ROM: full    Dental  (+) Chipped, Dental Advidsory Given   Pulmonary neg pulmonary ROS, neg shortness of breath, neg COPD, neg recent URI   Pulmonary exam normal        Cardiovascular hypertension, On Medications (-) angina + CAD  Normal cardiovascular exam     Neuro/Psych negative neurological ROS  negative psych ROS   GI/Hepatic negative GI ROS, Neg liver ROS,,,  Endo/Other  negative endocrine ROS    Renal/GU      Musculoskeletal   Abdominal   Peds  Hematology negative hematology ROS (+)   Anesthesia Other Findings Past Medical History: No date: Arthritis No date: Hyperlipemia No date: Hypertension  Past Surgical History: No date: COLONOSCOPY  BMI    Body Mass Index: 27.58 kg/m      Reproductive/Obstetrics negative OB ROS                             Anesthesia Physical Anesthesia Plan  ASA: 2  Anesthesia Plan: General   Post-op Pain Management:    Induction: Intravenous  PONV Risk Score and Plan: 3 and TIVA and Midazolam  Airway Management Planned: Natural Airway and Simple Face Mask  Additional Equipment:   Intra-op Plan:   Post-operative Plan:   Informed Consent: I have reviewed the patients History and Physical, chart, labs and discussed the procedure including the risks, benefits and alternatives for the proposed anesthesia with the patient or authorized representative who has indicated his/her understanding and acceptance.     Dental Advisory Given  Plan Discussed with: Anesthesiologist, CRNA and Surgeon  Anesthesia Plan Comments:  (Patient consented for risks of anesthesia including but not limited to:  - adverse reactions to medications - damage to eyes, teeth, lips or other oral mucosa - nerve damage due to positioning  - sore throat or hoarseness - Damage to heart, brain, nerves, lungs, other parts of body or loss of life  Patient voiced understanding.)       Anesthesia Quick Evaluation

## 2023-08-15 ENCOUNTER — Encounter: Payer: Self-pay | Admitting: Obstetrics and Gynecology

## 2023-08-16 LAB — SURGICAL PATHOLOGY

## 2023-08-16 NOTE — Anesthesia Postprocedure Evaluation (Signed)
Anesthesia Post Note  Patient: Tracy Nicholson  Procedure(s) Performed: DILATATION AND CURETTAGE /HYSTEROSCOPY  Patient location during evaluation: PACU Anesthesia Type: General Level of consciousness: awake and alert Pain management: pain level controlled Vital Signs Assessment: post-procedure vital signs reviewed and stable Respiratory status: spontaneous breathing, nonlabored ventilation, respiratory function stable and patient connected to nasal cannula oxygen Cardiovascular status: blood pressure returned to baseline and stable Postop Assessment: no apparent nausea or vomiting Anesthetic complications: no   No notable events documented.   Last Vitals:  Vitals:   08/14/23 1345 08/14/23 1410  BP: 110/84 (!) 150/79  Pulse: 78 75  Resp: 16 18  Temp: (!) 36.2 C (!) 36.2 C  SpO2: 97% 95%    Last Pain:  Vitals:   08/14/23 1425  TempSrc:   PainSc: 5                  Lenard Simmer

## 2023-08-20 ENCOUNTER — Encounter: Payer: Medicare HMO | Admitting: Obstetrics and Gynecology

## 2023-08-29 DIAGNOSIS — N9489 Other specified conditions associated with female genital organs and menstrual cycle: Secondary | ICD-10-CM | POA: Diagnosis not present

## 2023-09-02 ENCOUNTER — Other Ambulatory Visit (INDEPENDENT_AMBULATORY_CARE_PROVIDER_SITE_OTHER): Payer: Self-pay | Admitting: Nurse Practitioner

## 2023-09-03 ENCOUNTER — Other Ambulatory Visit: Payer: Self-pay

## 2023-09-03 ENCOUNTER — Encounter
Admission: RE | Admit: 2023-09-03 | Discharge: 2023-09-03 | Disposition: A | Discharge status: Home / Self Care | Payer: Medicare HMO | Source: Ambulatory Visit | Attending: Obstetrics and Gynecology | Admitting: Obstetrics and Gynecology

## 2023-09-03 DIAGNOSIS — N9489 Other specified conditions associated with female genital organs and menstrual cycle: Secondary | ICD-10-CM | POA: Insufficient documentation

## 2023-09-03 DIAGNOSIS — Z01818 Encounter for other preprocedural examination: Secondary | ICD-10-CM | POA: Insufficient documentation

## 2023-09-03 HISTORY — DX: Abdominal tenderness, unspecified site: R10.819

## 2023-09-03 HISTORY — DX: Diverticulosis of large intestine without perforation or abscess without bleeding: K57.30

## 2023-09-03 HISTORY — DX: Stricture and stenosis of cervix uteri: N88.2

## 2023-09-03 HISTORY — DX: Impaired glucose tolerance (oral): R73.02

## 2023-09-03 HISTORY — DX: Arthropathy, unspecified: M12.9

## 2023-09-03 HISTORY — DX: Pelvic and perineal pain: R10.2

## 2023-09-03 HISTORY — DX: Irritable bowel syndrome, unspecified: K58.9

## 2023-09-03 HISTORY — DX: Mixed hyperlipidemia: E78.2

## 2023-09-03 HISTORY — DX: Abnormal findings on diagnostic imaging of other specified body structures: R93.89

## 2023-09-03 LAB — TYPE AND SCREEN
ABO/RH(D): A POS
Antibody Screen: NEGATIVE

## 2023-09-03 LAB — CBC
HCT: 38.2 % (ref 36.0–46.0)
Hemoglobin: 12.8 g/dL (ref 12.0–15.0)
MCH: 28.4 pg (ref 26.0–34.0)
MCHC: 33.5 g/dL (ref 30.0–36.0)
MCV: 84.9 fL (ref 80.0–100.0)
Platelets: 349 10*3/uL (ref 150–400)
RBC: 4.5 MIL/uL (ref 3.87–5.11)
RDW: 14.6 % (ref 11.5–15.5)
WBC: 6.6 10*3/uL (ref 4.0–10.5)
nRBC: 0 % (ref 0.0–0.2)

## 2023-09-03 NOTE — H&P (Signed)
Tracy Nicholson is a 77 y.o. female here for ED follow up (New pt - ED follow up pelvic pain, cyst of ovary, u/s and ct done in ED)   Referring provider: Christy Gentles*   History of Present Illness: New pt presents for ED follow up. She presented to the ED 08/06/23 for nausea, vomiting, dehydration, inability to tolerate food and pelvic pain. She had previous imaging done on the 16th that revealed thickened endometrium and LO cysts. She denies vaginal bleeding.    CT showed colonic diverticulitis, without acute diverticulitis.    9/18: CA125 8.5    Today: She has been experiencing some low abdominal pressure and intermittent pain for years now. Lately, it was been more uncomfortable than normal and nausea was worsening.      TVUS 08/05/23 Uterus 8.7 x 4.1 x 4.6 cm  Endometrium 12 mm  RO not seen  LO with cysts, largest 2.5 cm    1. Thickened, heterogeneous appearance of the endometrial stripe. Correlate for any history of abnormal uterine bleeding as endometrial tissue sampling would then be indicated.  2. Small left ovarian cysts measuring up to 2.5 cm, likely physiologic. No followup imaging recommended.  3. Nonvisualization of the right ovary.      Past Medical History:  has a past medical history of Arthritis, Diverticulitis, Hyperlipidemia, and Hypertension.  Past Surgical History:  has no past surgical history on file. Family History: family history is not on file. Social History:  reports that she has never smoked. She has never used smokeless tobacco. She reports that she does not drink alcohol and does not use drugs. OB/GYN History:  OB History                 Gravida 5              Para 5              Term 5              Preterm                AB                Living 5                  SAB                IAB                Ectopic                Molar                Multiple                Live Births           Allergies:  has No Known Allergies. Medications: Current Medications Current Outpatient Medications:    acetaminophen (TYLENOL EXTRA STRENGTH ORAL), Take by mouth, Disp: , Rfl:    aspirin 81 MG EC tablet, Take 81 mg by mouth once daily., Disp: , Rfl:    losartan (COZAAR) 25 MG tablet, TAKE 1 TABLET(25 MG) BY MOUTH EVERY DAY, Disp: 90 tablet, Rfl: 1   meloxicam (MOBIC) 7.5 MG tablet, TAKE 1 TABLET(7.5 MG) BY MOUTH EVERY DAY, Disp: 90 tablet, Rfl: 0   multivitamin tablet, Take 1 tablet by mouth once daily, Disp: , Rfl:    omega-3 fatty acids/fish oil 340-1,000 mg capsule, Take 1 capsule  by mouth once daily., Disp: , Rfl:    ondansetron (ZOFRAN-ODT) 4 MG disintegrating tablet, Take 4 mg by mouth every 8 (eight) hours as needed, Disp: , Rfl:    rosuvastatin (CRESTOR) 20 MG tablet, take 1 tablet by mouth every day, Disp: 90 tablet, Rfl: 1   traMADoL (ULTRAM) 50 mg tablet, Take 50 mg by mouth every 6 (six) hours as needed, Disp: , Rfl:    triamterene-hydroCHLOROthiazide (MAXZIDE-25) 37.5-25 mg tablet, TAKE 1 TABLET BY MOUTH EVERY DAY, Disp: 90 tablet, Rfl: 3   vit C/E/Zn/coppr/lutein/zeaxan (PRESERVISION AREDS-2 ORAL), Take by mouth, Disp: , Rfl:    amoxicillin-clavulanate (AUGMENTIN) 875-125 mg tablet, Take 1 tablet (875 mg total) by mouth every 12 (twelve) hours for 10 days, Disp: 20 tablet, Rfl: 0   aspirin 81 MG EC tablet, Take 81 mg by mouth once daily, Disp: , Rfl:      Review of Systems: No SOB, no palpitations or chest pain, no new lower extremity edema, no nausea or vomiting or bowel or bladder complaints. See HPI for gyn specific ROS.    Exam:   BP (!) 166/81   Pulse 81   Ht 142.2 cm (4\' 8" )   Wt 53.8 kg (118 lb 9.6 oz)   BMI 26.59 kg/m     Constitutional:  General appearance: Well nourished, well developed female in no acute distress.  CV: RRR Pulm: CTAB Neuro/psych:  Normal mood and affect. No gross motor deficits. Neck:  Supple, normal appearance.  Respiratory:  Normal respiratory  effort, no use of accessory muscles Skin:  No visible rashes or external lesions       No pubic symphyasitis, pain able to be triggered by palpation to central line between top of mons and right below umbilicus. Also pain she has been feeling was triggered by EMBx.  Bilateral pudendal tenderness.   Pelvic:  deferred   Pap collected today   Endometrial biopsy: The cervix was cleaned with betadine, topical Hurriciane spray applied, and a single tooth tenaculum is applied to the anterior cervix. Cervix required dilation. The Pipelle catheter was placed into the endometrial cavity. It sounds to 7 cm and adequate tissue was removed.  Scant tissue No vaginal discharge noted A female chaperone was present for the more sensitive portions of the physical exam ( such as breast and pelvic)   Impression:   The primary encounter diagnosis was Thickened endometrium. Diagnoses of Diverticulosis of colon, Nausea and vomiting, unspecified vomiting type, Screening for cervical cancer, Cervical stenosis (uterine cervix), Pelvic pain in female, and Suprapubic tenderness were also pertinent to this visit.   Plan:  Patient returns for a preoperative discussion regarding her plans to proceed with definitive surgical treatment of her uterine tenderness by  robotic assisted total laparoscopic hysterectomy with bilateral salpingoophorectomy.  The patient and I discussed the technical aspects of the procedure including the potential for risks and complications.  These include but are not limited to the risk of infection requiring post-operative antibiotics or further procedures.  We talked about the risk of injury to adjacent organs including bladder, bowel, ureter, blood vessels or nerves.  We talked about the need to convert to an open incision.  We talked about the possible need for blood transfusion.  We talked about postop complications such as thromboembolic or cardiopulmonary complications.  All of her questions  were answered.  Her preoperative exam was completed and the appropriate consents were signed. She is scheduled to undergo this procedure in the near future.

## 2023-09-03 NOTE — Patient Instructions (Addendum)
Your procedure is scheduled on: Wednesday September 04, 2023. Report to the Registration Desk on the 1st floor of the [facility]. Your arrival time, : 6:30  If your arrival time is 6:00 am, do not arrive before that time as the [facility] entrance doors do not open until 6:00 am.  REMEMBER: Instructions that are not followed completely may result in serious medical risk, up to and including death; or upon the discretion of your surgeon and anesthesiologist your surgery may need to be rescheduled.  Do not eat food after midnight the night before surgery.  No gum chewing or hard candies.  You may however, drink CLEAR liquids up to 2 hours before you are scheduled to arrive for your surgery. Do not drink anything within 2 hours of your scheduled arrival time. Water Coffee Apple Juice as long as it has NO Pulp   One week prior to surgery: Stop Anti-inflammatories (NSAIDS) such as Advil, Aleve, Ibuprofen, Motrin, Naproxen, Naprosyn and Aspirin based products such as Excedrin, Goody's Powder, BC Powder. Stop ANY OVER THE COUNTER supplements until after surgery.  You may however, continue to take Tylenol if needed for pain up until the day of surgery.  Continue taking all of your other prescription medications up until the day of surgery.  ON THE DAY OF SURGERY ONLY TAKE THESE MEDICATIONS WITH SIPS OF WATER:  None    No Alcohol for 24 hours before or after surgery.  No Smoking including e-cigarettes for 24 hours before surgery.  No chewable tobacco products for at least 6 hours before surgery.  No nicotine patches on the day of surgery.  Do not use any "recreational" drugs for at least a week (preferably 2 weeks) before your surgery.  Please be advised that the combination of cocaine and anesthesia may have negative outcomes, up to and including death. If you test positive for cocaine, your surgery will be cancelled.  On the morning of surgery brush your teeth with toothpaste and  water, you may rinse your mouth with mouthwash if you wish. Do not swallow any toothpaste or mouthwash.  Use CHG Soap or wipes as directed on instruction sheet.  Do not wear jewelry, make-up, hairpins, clips or nail polish.  For welded (permanent) jewelry: bracelets, anklets, waist bands, etc.  Please have this removed prior to surgery.  If it is not removed, there is a chance that hospital personnel will need to cut it off on the day of surgery.  Do not wear lotions, powders, or perfumes.   Do not shave body hair from the neck down 48 hours before surgery.  Contact lenses, hearing aids and dentures may not be worn into surgery.  Do not bring valuables to the hospital. [facility] is not responsible for any missing/lost belongings or valuables.   Total Shoulder Arthroplasty:  use Benzoyl Peroxide 5% Gel as directed on instruction sheet.  Bring your C-PAP to the hospital in case you may have to spend the night.   Notify your doctor if there is any change in your medical condition (cold, fever, infection).  Wear comfortable clothing (specific to your surgery type) to the hospital.  After surgery, you can help prevent lung complications by doing breathing exercises.  Take deep breaths and cough every 1-2 hours. Your doctor may order a device called an Incentive Spirometer to help you take deep breaths. When coughing or sneezing, hold a pillow firmly against your incision with both hands. This is called "splinting." Doing this helps protect your  incision. It also decreases belly discomfort.  If you are being admitted to the hospital overnight, leave your suitcase in the car. After surgery it may be brought to your room.  In case of increased patient census, it may be necessary for you, the patient, to continue your postoperative care in the Same Day Surgery department.  If you are being discharged the day of surgery, you will not be allowed to drive home. You will need a responsible  individual to drive you home and stay with you for 24 hours after surgery.   If you are taking public transportation, you will need to have a responsible individual with you.  Please call the Pre-admissions Testing Dept. at [phone] if you have any questions about these instructions.  Surgery Visitation Policy:  Patients having surgery or a procedure may have two visitors.  Children under the age of 29 must have an adult with them who is not the patient.  Inpatient Visitation:    Visiting hours are 7 a.m. to 8 p.m. Up to four visitors are allowed at one time in a patient room. The visitors may rotate out with other people during the day.  One visitor age 31 or older may stay with the patient overnight and must be in the room by 8 p.m.     Preparing for Surgery with CHLORHEXIDINE GLUCONATE (CHG) Soap  Chlorhexidine Gluconate (CHG) Soap  o An antiseptic cleaner that kills germs and bonds with the skin to continue killing germs even after washing  o Used for showering the night before surgery and morning of surgery  Before surgery, you can play an important role by reducing the number of germs on your skin.  CHG (Chlorhexidine gluconate) soap is an antiseptic cleanser which kills germs and bonds with the skin to continue killing germs even after washing.  Please do not use if you have an allergy to CHG or antibacterial soaps. If your skin becomes reddened/irritated stop using the CHG.  1. Shower the NIGHT BEFORE SURGERY and the MORNING OF SURGERY with CHG soap.  2. If you choose to wash your hair, wash your hair first as usual with your normal shampoo.  3. After shampooing, rinse your hair and body thoroughly to remove the shampoo.  4. Use CHG as you would any other liquid soap. You can apply CHG directly to the skin and wash gently with a scrungie or a clean washcloth.  5. Apply the CHG soap to your body only from the neck down. Do not use on open wounds or open sores.  Avoid contact with your eyes, ears, mouth, and genitals (private parts). Wash face and genitals (private parts) with your normal soap.  6. Wash thoroughly, paying special attention to the area where your surgery will be performed.  7. Thoroughly rinse your body with warm water.  8. Do not shower/wash with your normal soap after using and rinsing off the CHG soap.  9. Pat yourself dry with a clean towel.  10. Wear clean pajamas to bed the night before surgery.  12. Place clean sheets on your bed the night of your first shower and do not sleep with pets.  13. Shower again with the CHG soap on the day of surgery prior to arriving at the hospital.  14. Do not apply any deodorants/lotions/powders.  15. Please wear clean clothes to the hospital.

## 2023-09-04 ENCOUNTER — Encounter: Payer: Self-pay | Admitting: Obstetrics and Gynecology

## 2023-09-04 ENCOUNTER — Encounter
Admission: RE | Disposition: A | Discharge status: Home / Self Care | Payer: Self-pay | Source: Home / Self Care | Attending: Obstetrics and Gynecology

## 2023-09-04 ENCOUNTER — Other Ambulatory Visit: Payer: Self-pay

## 2023-09-04 ENCOUNTER — Ambulatory Visit: Payer: Self-pay | Admitting: Anesthesiology

## 2023-09-04 ENCOUNTER — Ambulatory Visit
Admission: RE | Admit: 2023-09-04 | Discharge: 2023-09-04 | Disposition: A | Discharge status: Home / Self Care | Payer: Medicare HMO | Attending: Obstetrics and Gynecology | Admitting: Obstetrics and Gynecology

## 2023-09-04 DIAGNOSIS — N83202 Unspecified ovarian cyst, left side: Secondary | ICD-10-CM | POA: Diagnosis not present

## 2023-09-04 DIAGNOSIS — E860 Dehydration: Secondary | ICD-10-CM | POA: Diagnosis not present

## 2023-09-04 DIAGNOSIS — R9389 Abnormal findings on diagnostic imaging of other specified body structures: Secondary | ICD-10-CM | POA: Diagnosis not present

## 2023-09-04 DIAGNOSIS — N736 Female pelvic peritoneal adhesions (postinfective): Secondary | ICD-10-CM | POA: Insufficient documentation

## 2023-09-04 DIAGNOSIS — N838 Other noninflammatory disorders of ovary, fallopian tube and broad ligament: Secondary | ICD-10-CM | POA: Diagnosis not present

## 2023-09-04 DIAGNOSIS — R102 Pelvic and perineal pain: Secondary | ICD-10-CM | POA: Insufficient documentation

## 2023-09-04 DIAGNOSIS — I10 Essential (primary) hypertension: Secondary | ICD-10-CM | POA: Insufficient documentation

## 2023-09-04 DIAGNOSIS — N8003 Adenomyosis of the uterus: Secondary | ICD-10-CM | POA: Diagnosis not present

## 2023-09-04 DIAGNOSIS — N83201 Unspecified ovarian cyst, right side: Secondary | ICD-10-CM | POA: Diagnosis not present

## 2023-09-04 DIAGNOSIS — Z79899 Other long term (current) drug therapy: Secondary | ICD-10-CM | POA: Insufficient documentation

## 2023-09-04 DIAGNOSIS — N9489 Other specified conditions associated with female genital organs and menstrual cycle: Secondary | ICD-10-CM

## 2023-09-04 DIAGNOSIS — K5730 Diverticulosis of large intestine without perforation or abscess without bleeding: Secondary | ICD-10-CM | POA: Insufficient documentation

## 2023-09-04 DIAGNOSIS — N888 Other specified noninflammatory disorders of cervix uteri: Secondary | ICD-10-CM | POA: Diagnosis not present

## 2023-09-04 DIAGNOSIS — N83292 Other ovarian cyst, left side: Secondary | ICD-10-CM | POA: Diagnosis not present

## 2023-09-04 HISTORY — PX: CYSTOSCOPY: SHX5120

## 2023-09-04 HISTORY — PX: ROBOTIC ASSISTED TOTAL HYSTERECTOMY WITH BILATERAL SALPINGO OOPHERECTOMY: SHX6086

## 2023-09-04 LAB — ABO/RH: ABO/RH(D): A POS

## 2023-09-04 SURGERY — HYSTERECTOMY, TOTAL, ROBOT-ASSISTED, LAPAROSCOPIC, WITH BILATERAL SALPINGO-OOPHORECTOMY
Anesthesia: General | Site: Bladder

## 2023-09-04 MED ORDER — IBUPROFEN 600 MG PO TABS: 600.0000 mg | ORAL_TABLET | Freq: Three times a day (TID) | ORAL | 1 refills | Status: AC

## 2023-09-04 MED ORDER — CEFAZOLIN SODIUM-DEXTROSE 2-4 GM/100ML-% IV SOLN
2.0000 g | INTRAVENOUS | Status: AC
  Administered 2023-09-04: 2 g via INTRAVENOUS

## 2023-09-04 MED ORDER — CHLORHEXIDINE GLUCONATE 0.12 % MT SOLN
15.0000 mL | Freq: Once | OROMUCOSAL | Status: AC
  Administered 2023-09-04: 15 mL via OROMUCOSAL

## 2023-09-04 MED ORDER — EPHEDRINE 5 MG/ML INJ
INTRAVENOUS | Status: AC
  Filled 2023-09-04: qty 5

## 2023-09-04 MED ORDER — ONDANSETRON HCL 4 MG/2ML IJ SOLN
INTRAMUSCULAR | Status: AC
  Filled 2023-09-04: qty 2

## 2023-09-04 MED ORDER — CEFAZOLIN SODIUM-DEXTROSE 2-4 GM/100ML-% IV SOLN
INTRAVENOUS | Status: AC
  Filled 2023-09-04: qty 100

## 2023-09-04 MED ORDER — LIDOCAINE HCL (CARDIAC) PF 100 MG/5ML IV SOSY
PREFILLED_SYRINGE | INTRAVENOUS | Status: DC | PRN
  Administered 2023-09-04: 100 mg via INTRAVENOUS

## 2023-09-04 MED ORDER — ACETAMINOPHEN 500 MG PO TABS
1000.0000 mg | ORAL_TABLET | ORAL | Status: AC
  Administered 2023-09-04: 1000 mg via ORAL

## 2023-09-04 MED ORDER — 0.9 % SODIUM CHLORIDE (POUR BTL) OPTIME
TOPICAL | Status: DC | PRN
  Administered 2023-09-04: 500 mL

## 2023-09-04 MED ORDER — FENTANYL CITRATE (PF) 100 MCG/2ML IJ SOLN
INTRAMUSCULAR | Status: DC | PRN
  Administered 2023-09-04: 50 ug via INTRAVENOUS
  Administered 2023-09-04 (×2): 25 ug via INTRAVENOUS

## 2023-09-04 MED ORDER — OXYCODONE HCL 5 MG PO TABS
5.0000 mg | ORAL_TABLET | Freq: Once | ORAL | Status: AC | PRN
  Administered 2023-09-04: 5 mg via ORAL

## 2023-09-04 MED ORDER — OXYCODONE HCL 5 MG/5ML PO SOLN: 5.0000 mg | Freq: Once | ORAL | Status: AC | PRN

## 2023-09-04 MED ORDER — GABAPENTIN 100 MG PO CAPS: 100.0000 mg | ORAL_CAPSULE | Freq: Every day | ORAL | 2 refills | Status: AC

## 2023-09-04 MED ORDER — DEXAMETHASONE SODIUM PHOSPHATE 10 MG/ML IJ SOLN
INTRAMUSCULAR | Status: DC | PRN
  Administered 2023-09-04: 10 mg via INTRAVENOUS

## 2023-09-04 MED ORDER — FENTANYL CITRATE (PF) 100 MCG/2ML IJ SOLN
INTRAMUSCULAR | Status: AC
  Filled 2023-09-04: qty 2

## 2023-09-04 MED ORDER — ACETAMINOPHEN EXTRA STRENGTH 500 MG PO TABS: 1000.0000 mg | ORAL_TABLET | Freq: Four times a day (QID) | ORAL | 0 refills | Status: AC

## 2023-09-04 MED ORDER — ONDANSETRON HCL 4 MG/2ML IJ SOLN: 4.0000 mg | Freq: Once | INTRAMUSCULAR | Status: DC | PRN

## 2023-09-04 MED ORDER — ACETAMINOPHEN 500 MG PO TABS
ORAL_TABLET | ORAL | Status: AC
  Filled 2023-09-04: qty 2

## 2023-09-04 MED ORDER — GABAPENTIN 300 MG PO CAPS
300.0000 mg | ORAL_CAPSULE | ORAL | Status: AC
  Administered 2023-09-04: 300 mg via ORAL

## 2023-09-04 MED ORDER — EPHEDRINE SULFATE-NACL 50-0.9 MG/10ML-% IV SOSY
PREFILLED_SYRINGE | INTRAVENOUS | Status: DC | PRN
  Administered 2023-09-04: 5 mg via INTRAVENOUS

## 2023-09-04 MED ORDER — BUPIVACAINE HCL (PF) 0.5 % IJ SOLN
INTRAMUSCULAR | Status: DC | PRN
  Administered 2023-09-04: 10 mL

## 2023-09-04 MED ORDER — ORAL CARE MOUTH RINSE: 15.0000 mL | Freq: Once | OROMUCOSAL | Status: AC

## 2023-09-04 MED ORDER — OXYCODONE HCL 5 MG PO TABS
ORAL_TABLET | ORAL | Status: AC
  Filled 2023-09-04: qty 1

## 2023-09-04 MED ORDER — PROPOFOL 10 MG/ML IV BOLUS
INTRAVENOUS | Status: DC | PRN
  Administered 2023-09-04: 100 mg via INTRAVENOUS

## 2023-09-04 MED ORDER — CHLORHEXIDINE GLUCONATE 0.12 % MT SOLN
OROMUCOSAL | Status: AC
  Filled 2023-09-04: qty 15

## 2023-09-04 MED ORDER — LACTATED RINGERS IV SOLN: INTRAVENOUS | Status: DC

## 2023-09-04 MED ORDER — GABAPENTIN 300 MG PO CAPS
ORAL_CAPSULE | ORAL | Status: AC
  Filled 2023-09-04: qty 1

## 2023-09-04 MED ORDER — ROCURONIUM BROMIDE 100 MG/10ML IV SOLN
INTRAVENOUS | Status: DC | PRN
  Administered 2023-09-04: 30 mg via INTRAVENOUS
  Administered 2023-09-04: 20 mg via INTRAVENOUS

## 2023-09-04 MED ORDER — DEXAMETHASONE SODIUM PHOSPHATE 10 MG/ML IJ SOLN
INTRAMUSCULAR | Status: AC
  Filled 2023-09-04: qty 1

## 2023-09-04 MED ORDER — DEXMEDETOMIDINE HCL IN NACL 80 MCG/20ML IV SOLN
INTRAVENOUS | Status: DC | PRN
Start: 2023-09-04 — End: 2023-09-04
  Administered 2023-09-04: 4 ug via INTRAVENOUS

## 2023-09-04 MED ORDER — SUGAMMADEX SODIUM 200 MG/2ML IV SOLN
INTRAVENOUS | Status: DC | PRN
  Administered 2023-09-04: 200 mg via INTRAVENOUS

## 2023-09-04 MED ORDER — BUPIVACAINE HCL (PF) 0.5 % IJ SOLN
INTRAMUSCULAR | Status: AC
  Filled 2023-09-04: qty 30

## 2023-09-04 MED ORDER — LACTATED RINGERS IV SOLN: INTRAVENOUS | Status: AC

## 2023-09-04 MED ORDER — ONDANSETRON HCL 4 MG/2ML IJ SOLN
INTRAMUSCULAR | Status: DC | PRN
  Administered 2023-09-04: 4 mg via INTRAVENOUS

## 2023-09-04 MED ORDER — POVIDONE-IODINE 10 % EX SWAB
2.0000 | Freq: Once | CUTANEOUS | Status: AC
  Administered 2023-09-04: 2 via TOPICAL

## 2023-09-04 MED ORDER — SEVOFLURANE IN SOLN
RESPIRATORY_TRACT | Status: AC
  Filled 2023-09-04: qty 250

## 2023-09-04 MED ORDER — FENTANYL CITRATE (PF) 100 MCG/2ML IJ SOLN
25.0000 ug | INTRAMUSCULAR | Status: DC | PRN
  Administered 2023-09-04 (×4): 50 ug via INTRAVENOUS

## 2023-09-04 MED ORDER — OXYCODONE HCL 5 MG PO TABS
5.0000 mg | ORAL_TABLET | ORAL | 0 refills | Status: AC | PRN
Start: 2023-09-04 — End: ?

## 2023-09-04 SURGICAL SUPPLY — 68 items
ADH SKN CLS APL DERMABOND .7 (GAUZE/BANDAGES/DRESSINGS) ×2
APL SRG 38 LTWT LNG FL B (MISCELLANEOUS)
APPLICATOR ARISTA FLEXITIP XL (MISCELLANEOUS) IMPLANT
BAG DRN RND TRDRP ANRFLXCHMBR (UROLOGICAL SUPPLIES) ×2
BAG URINE DRAIN 2000ML AR STRL (UROLOGICAL SUPPLIES) ×2 IMPLANT
BLADE SURG SZ11 CARB STEEL (BLADE) ×2 IMPLANT
CANNULA CAP OBTURATR AIRSEAL 8 (CAP) ×2 IMPLANT
CATH FOLEY 2WAY 5CC 16FR (CATHETERS) ×2
CATH ROBINSON RED A/P 16FR (CATHETERS) ×2 IMPLANT
CATH URTH 16FR FL 2W BLN LF (CATHETERS) ×2 IMPLANT
COUNTER NEEDLE 20/40 LG (NEEDLE) ×2 IMPLANT
COVER MAYO STAND STRL (DRAPES) IMPLANT
COVER TIP SHEARS 8 DVNC (MISCELLANEOUS) ×2 IMPLANT
DERMABOND ADVANCED .7 DNX12 (GAUZE/BANDAGES/DRESSINGS) ×2 IMPLANT
DRAPE ARM DVNC X/XI (DISPOSABLE) ×8 IMPLANT
DRAPE COLUMN DVNC XI (DISPOSABLE) ×2 IMPLANT
DRAPE ROBOT W/ LEGGING 30X125 (DRAPES) ×2 IMPLANT
DRAPE SHEET LG 3/4 BI-LAMINATE (DRAPES) ×2 IMPLANT
DRIVER NDL MEGA 8 DVNC XI (INSTRUMENTS) ×2 IMPLANT
DRIVER NDLE MEGA DVNC XI (INSTRUMENTS) ×2
ELECT REM PT RETURN 9FT ADLT (ELECTROSURGICAL) ×2
ELECTRODE REM PT RTRN 9FT ADLT (ELECTROSURGICAL) ×2 IMPLANT
FORCEPS BPLR FENES DVNC XI (FORCEP) ×2 IMPLANT
GAUZE 4X4 16PLY ~~LOC~~+RFID DBL (SPONGE) ×2 IMPLANT
GLOVE BIO SURGEON STRL SZ7 (GLOVE) ×8 IMPLANT
GLOVE INDICATOR 7.5 STRL GRN (GLOVE) ×8 IMPLANT
GOWN STRL REUS W/ TWL LRG LVL3 (GOWN DISPOSABLE) ×12 IMPLANT
GOWN STRL REUS W/TWL LRG LVL3 (GOWN DISPOSABLE) ×12
HEMOSTAT ARISTA ABSORB 3G PWDR (HEMOSTASIS) IMPLANT
IRRIGATION STRYKERFLOW (MISCELLANEOUS) IMPLANT
IRRIGATOR STRYKERFLOW (MISCELLANEOUS) ×2
IV LACTATED RINGERS 1000ML (IV SOLUTION) ×2 IMPLANT
IV NS 1000ML (IV SOLUTION) ×2
IV NS 1000ML BAXH (IV SOLUTION) IMPLANT
KIT PINK PAD W/HEAD ARE REST (MISCELLANEOUS) ×2
KIT PINK PAD W/HEAD ARM REST (MISCELLANEOUS) ×2 IMPLANT
LABEL OR SOLS (LABEL) ×2 IMPLANT
MANIFOLD NEPTUNE II (INSTRUMENTS) ×2 IMPLANT
MANIPULATOR VCARE LG CRV RETR (MISCELLANEOUS) IMPLANT
MANIPULATOR VCARE SML CRV RETR (MISCELLANEOUS) IMPLANT
MANIPULATOR VCARE STD CRV RETR (MISCELLANEOUS) IMPLANT
NS IRRIG 1000ML POUR BTL (IV SOLUTION) ×2 IMPLANT
NS IRRIG 500ML POUR BTL (IV SOLUTION) ×2 IMPLANT
OBTURATOR OPTICAL STND 8 DVNC (TROCAR) ×2
OBTURATOR OPTICALSTD 8 DVNC (TROCAR) ×2 IMPLANT
OCCLUDER COLPOPNEUMO (BALLOONS) ×2 IMPLANT
PACK GYN LAPAROSCOPIC (MISCELLANEOUS) ×2 IMPLANT
PAD OB MATERNITY 4.3X12.25 (PERSONAL CARE ITEMS) ×2 IMPLANT
PAD PREP OB/GYN DISP 24X41 (PERSONAL CARE ITEMS) ×2 IMPLANT
SCISSORS MNPLR CVD DVNC XI (INSTRUMENTS) ×2 IMPLANT
SCRUB CHG 4% DYNA-HEX 4OZ (MISCELLANEOUS) ×2 IMPLANT
SEAL UNIV 5-12 XI (MISCELLANEOUS) ×6 IMPLANT
SEALER VESSEL EXT DVNC XI (MISCELLANEOUS) ×2 IMPLANT
SET CYSTO W/LG BORE CLAMP LF (SET/KITS/TRAYS/PACK) ×2 IMPLANT
SET TUBE FILTERED XL AIRSEAL (SET/KITS/TRAYS/PACK) ×2 IMPLANT
SOL ELECTROSURG ANTI STICK (MISCELLANEOUS) ×2
SOL PREP PVP 2OZ (MISCELLANEOUS) ×2
SOLUTION ELECTROSURG ANTI STCK (MISCELLANEOUS) ×2 IMPLANT
SOLUTION PREP PVP 2OZ (MISCELLANEOUS) ×2 IMPLANT
SURGILUBE 2OZ TUBE FLIPTOP (MISCELLANEOUS) ×2 IMPLANT
SUT MNCRL 4-0 (SUTURE) ×2
SUT MNCRL 4-0 27XMFL (SUTURE) ×2
SUT STRATA PDS 0 30 CT-2.5 (SUTURE) IMPLANT
SUT VIC AB 0 CT2 27 (SUTURE) ×4 IMPLANT
SUT VLOC 90 2/L VL 12 GS22 (SUTURE) ×2 IMPLANT
SUTURE MNCRL 4-0 27XMF (SUTURE) ×2 IMPLANT
TRAP FLUID SMOKE EVACUATOR (MISCELLANEOUS) ×2 IMPLANT
WATER STERILE IRR 500ML POUR (IV SOLUTION) ×2 IMPLANT

## 2023-09-04 NOTE — Anesthesia Preprocedure Evaluation (Addendum)
Anesthesia Evaluation  Patient identified by MRN, date of birth, ID band Patient awake    Reviewed: Allergy & Precautions, NPO status , Patient's Chart, lab work & pertinent test results  History of Anesthesia Complications Negative for: history of anesthetic complications  Airway Mallampati: II  TM Distance: >3 FB Neck ROM: full    Dental no notable dental hx.    Pulmonary neg pulmonary ROS   Pulmonary exam normal        Cardiovascular hypertension, On Medications Normal cardiovascular exam     Neuro/Psych negative neurological ROS  negative psych ROS   GI/Hepatic negative GI ROS, Neg liver ROS,,,  Endo/Other  negative endocrine ROS    Renal/GU Renal disease     Musculoskeletal  (+) Arthritis ,    Abdominal   Peds  Hematology negative hematology ROS (+)   Anesthesia Other Findings Past Medical History: No date: Arthritis No date: Arthritis involving multiple sites No date: Cervical stenosis (uterine cervix) No date: Diverticulosis of colon No date: Hyperlipemia No date: Hypertension No date: Impaired glucose tolerance No date: Irritable bowel syndrome, unspecified type No date: Mixed hyperlipidemia No date: Pelvic pain in female No date: Suprapubic tenderness No date: Thickened endometrium  Past Surgical History: 12/25/2022: CATARACT EXTRACTION W/PHACO; Right     Comment:  Procedure: CATARACT EXTRACTION PHACO AND INTRAOCULAR               LENS PLACEMENT (IOC) RIGHT 7.16 00:49.0;  Surgeon:               Estanislado Pandy, MD;  Location: [facility]               [facility];  Service: Ophthalmology;  Laterality: Right; 01/10/2023: CATARACT EXTRACTION W/PHACO; Left     Comment:  Procedure: CATARACT EXTRACTION PHACO AND INTRAOCULAR               LENS PLACEMENT (IOC) LEFT  7.85  00:54.5;  Surgeon:               Estanislado Pandy, MD;  Location: [facility]               CNTR;  Service:  Ophthalmology;  Laterality: Left; No date: COLONOSCOPY 08/14/2023: HYSTEROSCOPY WITH D & C; N/A     Comment:  Procedure: DILATATION AND CURETTAGE /HYSTEROSCOPY;                Surgeon: Christeen Douglas, MD;  Location: [facility] ORS;                Service: Gynecology;  Laterality: N/A;  BMI    Body Mass Index: 26.45 kg/m      Reproductive/Obstetrics negative OB ROS                             Anesthesia Physical Anesthesia Plan  ASA: 2  Anesthesia Plan: General ETT   Post-op Pain Management: Ofirmev IV (intra-op)*, Toradol IV (intra-op)* and Ketamine IV*   Induction: Intravenous  PONV Risk Score and Plan: 3 and Ondansetron, Dexamethasone and Treatment may vary due to age or medical condition  Airway Management Planned: Oral ETT  Additional Equipment:   Intra-op Plan:   Post-operative Plan: Extubation in OR  Informed Consent: I have reviewed the patients History and Physical, chart, labs and discussed the procedure including the risks, benefits and alternatives for the proposed anesthesia with the patient or authorized representative who has indicated his/her understanding and acceptance.  Dental Advisory Given  Plan Discussed with: Anesthesiologist, CRNA and Surgeon  Anesthesia Plan Comments: (Patient consented for risks of anesthesia including but not limited to:  - adverse reactions to medications - damage to eyes, teeth, lips or other oral mucosa - nerve damage due to positioning  - sore throat or hoarseness - Damage to heart, brain, nerves, lungs, other parts of body or loss of life  Patient voiced understanding and assent.)       Anesthesia Quick Evaluation

## 2023-09-04 NOTE — Anesthesia Postprocedure Evaluation (Signed)
Anesthesia Post Note  Patient: Tracy Nicholson  Procedure(s) Performed: XI ROBOTIC ASSISTED TOTAL HYSTERECTOMY WITH BILATERAL SALPINGO OOPHORECTOMY (Bilateral: Abdomen) CYSTOSCOPY WITH HYDRODISTENTION (Bladder)  Patient location during evaluation: PACU Anesthesia Type: General Level of consciousness: awake and alert Pain management: pain level controlled Vital Signs Assessment: post-procedure vital signs reviewed and stable Respiratory status: spontaneous breathing, nonlabored ventilation, respiratory function stable and patient connected to nasal cannula oxygen Cardiovascular status: blood pressure returned to baseline and stable Postop Assessment: no apparent nausea or vomiting Anesthetic complications: no   No notable events documented.   Last Vitals:  Vitals:   09/04/23 1234 09/04/23 1341  BP: (!) 157/75 (!) 166/73  Pulse: 80 78  Resp: 18   Temp: (!) 36.2 C   SpO2: 98% 99%    Last Pain:  Vitals:   09/04/23 1341  TempSrc:   PainSc: 5                  Corinda Gubler

## 2023-09-04 NOTE — Op Note (Signed)
Tracy Nicholson PROCEDURE DATE: 09/04/2023  PREOPERATIVE DIAGNOSIS: Acute pelvic pain POSTOPERATIVE DIAGNOSIS: The same PROCEDURE:  XI ROBOTIC ASSISTED TOTAL HYSTERECTOMY WITH BILATERAL SALPINGO OOPHORECTOMY:  CYSTOSCOPY WITH HYDRODISTENTION: 52000 (CPT)  SURGEON:  Dr. Christeen Douglas, MD ASSISTANT: RNFA Anesthesiologist:  Anesthesiologist: Louie Boston, MD; Corinda Gubler, MD CRNA: Jeanine Luz, CRNA; Omer Jack, CRNA  INDICATIONS: 77 y.o. F  here for definitive surgical management secondary to the indications listed under preoperative diagnoses; please see preoperative note for further details.  Risks of surgery were discussed with the patient including but not limited to: bleeding which may require transfusion or reoperation; infection which may require antibiotics; injury to bowel, bladder, ureters or other surrounding organs; need for additional procedures; thromboembolic phenomenon, incisional problems and other postoperative/anesthesia complications. Written informed consent was obtained.    FINDINGS:    External genitalia, vaginal canal and cervix negative for lesions. Intraoperative findings revealed a normal upper abdomen including bowel, liver, diaphragmatic surfaces, stomach, and omentum.   The uterus was small and mobile. There were congested vascular areas in the bilateral adnexa, L>R The right ovary appeared normal. Left ovary with large simple appearing ovarian cyst Bilateral tubes appeared normal.  Appendix wnl  The sigmoid bowel was adherent to the left pelvic sidewall, with diverticula visible. There were filmy adhesions that appeared unlike any other adhesions localized to the diverticula on this side.   ANESTHESIA:    General INTRAVENOUS FLUIDS:400  ml ESTIMATED BLOOD LOSS:20 ml URINE OUTPUT: 20 ml  SPECIMENS: Uterus, cervix, bilateral fallopian tubes and bilateral ovaries COMPLICATIONS: None immediate   RATLH/BS:  PROCEDURE IN DETAIL: After  informed consent was obtained, the patient was taken to the operating room where general anesthesia was obtained without difficulty. The patient was positioned in the dorsal lithotomy position in Leslie stirrups and her arms were carefully tucked at her sides and the usual precautions were taken. Deep Trendelenburg (20-25 deg) was established to confirm that she does not shift on the table.  She was prepped and draped in normal sterile fashion.  Time-out was performed and a Foley catheter was placed into the bladder. A standard VCare uterine manipulator was then placed in the uterus without incident.  Preoperative prophylactic antibiotics were given through her iv.  After infiltration of local anesthetic at the proposed trocar sites, an 8 mm incision was created at the umbilicus, and an AirSeal 5mm was placed under direct visualization, after confirmation of OG tube working well. Pneumoperitoneum was created to a pressure of 15 mm Hg. The camera was placed and the abdomin surveyed, noting intact bowel below the site of entry. A survey of the pelvis and upper abdomen revealed the above findings. One right and one left lateral 8-mm robotic ports were placed under direct visualization.  The patient was placed in deepTrendelenburg and the bowel was displaced up into the upper abdomen. The robot was left side docked. The instruments were placed under direct visualization.   The ureters were identified bilaterally coursing outside of the operative field. Round ligaments were divided on each side with the EndoShears and the retroperitoneal space was opened bilaterally. The posterior leaflet of the broad was taken down to the level of the IP ligament. The anterior leaflet of the broad ligament was carefully taken down to the midline.  A bladder flap was created and the bladder was dissected down off the lower uterine segment and cervix using endoshears and electrocautery.    Ovariolysis was performed and the ovary  was dissected medially with  care to avoid the ureter, >2cm from the ovary. The infundibulopelvic ligaments were skeletonized, sealed and divided. The peritoneum was taken down to the level of the internal os, and the uterine arteries skeletonized. With strong cephalad pressure from the V-care, bipolar cautery was used to seal and transect the uterine arteries, and the pedicles allowed to fall away laterally.  A colpotomy was performed circumferentially along the V-Care ring with monopolar electrocautery and the cervix was incised from the vagina using the laparoscopic scissors. The specimen was removed through the vagina.  A pneumo balloon was placed in the vagina and the vaginal cuff was then closed in a running continuous fashion using the  0 barbed suture with careful attention to include the vaginal cuff angles, the uterosacral ligaments and the vaginal mucosa within the closure.  Hemostasis was secured with intraabdominal pressure and review of all surgical sites. The intraperitoneal pressure was dropped, and all planes of dissection, vascular pedicles and the vaginal cuff were found to be hemostatic.  The robot was undocked.   Attention was turned to the bladder and cystoscopy showed vigorous bilateral ureteral jets.  No evidence of bladder mucosal damage, irritation or Hunners ulcers. of hydrodistention for 3 minutes.  The lateral trocars were removed under visualization.  The CO2 gas was released and several deep breaths given to remove any remaining CO2 from the peritoneal cavity.  The skin incisions were closed with 4-0 Monocryl subcuticular stitch and Dermabond.    Anesthesia was reversed without difficulty.  The patient tolerated the procedure well.  Sponge, lap and needle counts were correct x2.  The patient was taken to recovery room in excellent condition.

## 2023-09-04 NOTE — Discharge Instructions (Addendum)
Discharge instructions after  robotically-assisted total laparoscopic hysterectomy   For the next three days, take ibuprofen and acetaminophen on a schedule, every 8 hours. You can take them together or you can intersperse them, and take one every four hours. I also gave you gabapentin for nighttime, to help you sleep and also to control pain. Take gabapentin medicines at night for at least the next 3 nights. You also have a narcotic, oxycodone, to take as needed if the above medicines don't help.  Postop constipation is a major cause of pain. Stay well hydrated, walk as you tolerate, and take over the counter senna as well as stool softeners if you need them.   Signs and Symptoms to Report Call our office at [phone] if you have any of the following.   Fever over 100.4 degrees or higher  Severe stomach pain not relieved with pain medications  Bright red bleeding that's heavier than a period that does not slow with rest  To go the bathroom a lot (frequency), you can't hold your urine (urgency), or it hurts when you empty your bladder (urinate)  Chest pain  Shortness of breath  Pain in the calves of your legs  Severe nausea and vomiting not relieved with anti-nausea medications  Signs of infection around your wounds, such as redness, hot to touch, swelling, green/yellow drainage (like pus), bad smelling discharge  Any concerns  What You Can Expect after Surgery  You may see some pink tinged, bloody fluid and bruising around the wound. This is normal.  You may notice shoulder and neck pain. This is caused by the gas used during surgery to expand your abdomen so your surgeon could get to the uterus easier.  You may have a sore throat because of the tube in your mouth during general anesthesia. This will go away in 2 to 3 days.  You may have some stomach cramps.  You may notice spotting on your panties.  You may have pain around the incision sites.   Activities after Your  Discharge Follow these guidelines to help speed your recovery at home:  Do the coughing and deep breathing as you did in the hospital for 2 weeks. Use the small blue breathing device, called the incentive spirometer for 2 weeks.  Don't drive if you are in pain or taking narcotic pain medicine. You may drive when you can safely slam on the brakes, turn the wheel forcefully, and rotate your torso comfortably. This is typically 1-2 weeks. Practice in a parking lot or side street prior to attempting to drive regularly.   Ask others to help with household chores for 4 weeks.  Do not lift anything heavier that 10 pounds for 4-6 weeks. This includes pets, children, and groceries.  Don't do strenuous activities, exercises, or sports like vacuuming, tennis, squash, etc. until your doctor says it is safe to do so. ---Maintain pelvic rest for 12 weeks. This means nothing in the vagina or rectum at all (no douching, tampons, intercourse) for 12 weeks.   Walk as you feel able. Rest often since it may take two or three weeks for your energy level to return to normal.   You may climb stairs  Avoid constipation:   -Eat fruits, vegetables, and whole grains. Eat small meals as your appetite will take time to return to normal.   -Drink 6 to 8 glasses of water each day unless your doctor has told you to limit your fluids.   -Use a laxative or  stool softener as needed if constipation becomes a problem. You may take Miralax, metamucil, Citrucil, Colace, Senekot, FiberCon, etc. If this does not relieve the constipation, try two tablespoons of Milk Of Magnesia every 8 hours until your bowels move.   You may shower. Gently wash the wounds with a mild soap and water. Pat dry.  Do not get in a hot tub, swimming pool, etc. for 6 weeks.  Do not use lotions, oils, powders on the wounds.  Do not douche, use tampons, or have sex until your doctor says it is okay.  Take your pain medicine when you need it. The medicine may not  work as well if the pain is bad.  Take the medicines you were taking before surgery. Other medications you will need are pain medications (Norco or Percocet) and nausea medications (Zofran).  Here is a helpful article from the website http://mitchell.org/, regarding constipation  Here are reasons why constipation occurs after surgery: 1) During the operation and in the recovery room, most people are given opioid pain medication, primarily through an IV, to treat moderate or severe pain. Intravenous opioids include morphine, Dilaudid and fentanyl. After surgery, patients are often prescribed opioid pain medication to take by mouth at home, including codeine, Vicodin, Norco, and Percocet. All of these medications cause constipation by slowing down the movement of your intestine. 2) Changes in your diet before surgery can be another culprit. It is common to get specific instructions to change how you normally eat or drink before your surgery, like only having liquids the day before or not having anything to eat or drink after midnight the night before surgery. For this reason, temporary dehydration may occur. This, along with not eating or only having liquids, means that you are getting less fiber than usual. Both these factors contribute to constipation. 3) Changes in your diet after surgery can also contribute to the problem. Although many people don't have dietary restrictions after operations, being under anesthesia can make you lose your appetite for several hours and maybe even days. Some people can even have nausea or vomiting. Not eating or drinking normally means that you are not getting enough fiber and you can get dehydrated, both leading to constipation. 4) Lying in a bed more than usual--which happens before, during and after surgery--combined with the medications and diet changes, all work together to slow down your colon and make your poop turn to rock.  No one likes to be constipated.  Let's face  it, it's not a pleasant feeling when you don't poop for days, then strain on the toilet to finally pass something large enough to cause damage. An ounce of prevention is worth a pound of cure, so: Assume you will be constipated. Plan and prepare accordingly. Post-surgery is one of those unique situations where the temporary use of laxatives can make a world of difference. Always consult with your doctor, and recognize that if you wait several days after surgery to take a laxative, the constipation might be too severe for these over-the-counter options. It is always important to discuss all medications you plan on taking with your doctor. Ask your doctor if you can start the laxative immediately after surgery. *  Here are go-to post-surgery laxatives: Senna: Senna is an herb that acts as a "stimulant laxative," meaning it increases the activity of the intestine to cause you to have a bowel movement. It comes in many forms, but senna pills are easy to take and are sold over the counter  at almost all pharmacies. Since opioid pain medications slow down the activity of the intestine, it makes sense to take a medication to help reverse that side effect. Long-term use of a stimulant laxative is not a good idea since it can make your colon "lazy" and not function properly; however, temporary use immediately after surgery is acceptable. In general, if you are able to eat a normal diet, taking senna soon after surgery works the best. Senna usually works within hours to produce a bowel movement, but this is less predictable when you are taking different medications after surgery. Try not to wait several days to start taking senna, as often it is too late by then. Just like with all medications or supplements, check with your doctor before starting new treatment.   Magnesium: Magnesium is an important mineral that our body needs. We get magnesium from some foods that we eat, especially foods that are high in fiber such  as broccoli, almonds and whole grains. There are also magnesium-based medications used to treat constipation including milk of magnesia (magnesium hydroxide), magnesium citrate and magnesium oxide. They work by drawing water into the intestine, putting it into the class of "osmotic" laxatives. Magnesium products in low doses appear to be safe, but if taken in very large doses, can lead to problems such as irregular heartbeat, low blood pressure and even death. It can also affect other medications you might be taking, therefore it is important to discuss using magnesium with your physician and pharmacist before initiating therapy. Most over-the-counter magnesium laxatives work very well to help with the constipation related to surgery, but sometimes they work too well and lead to diarrhea. Make sure you are somewhere with easy access to a bathroom, just in case.   Bisacodyl: Bisacodyl (generic name) is sold under brand names such as Dulcolax. Much like senna, it is a "stimulant laxative," meaning it makes your intestines move more quickly to push out the stool. This is another good choice to start taking as soon as your doctor says you can take a laxative after surgery. It comes in pill form and as a suppository, which is a good choice for people who cannot or are not allowed to swallow pills. Studies have shown that it works as a laxative, but like most of these medications, you should use this on a short-term basis only.   Enema: Enemas strike fear in many people, but FEAR NOT! It's nowhere near as big a deal as you may think. An enema is just a way to get some liquid into your rectum by placing a specially designed device through your anus. If you have never done one, it might seem like a painful, unpleasant, uncomfortable, complicated and lengthy procedure. But in reality, it's simple, takes just a few seconds and is highly effective. The small ready-made bottles you buy at the pharmacy are much easier than  the hose/large rubber container type. Those recommended positions illustrated in some instructions are generally not necessary to place the enema. It's very similar to the insertion of a tampon, requiring a slight squat. Some extra lubrication on the enema's tip (or on your anus) will make it a breeze. In certain cases, there is no substitute for a good enema. For example, if someone has not pooped for a few days, the beginning of the poop waiting to come out can become rock hard. Passing that hard stool can lead to much pain and problems like anal fissures. Inserting a little liquid to break  up the rock-hard stool will help make its passage much easier. Enemas come with different liquids. Most come with saline, but there are also mineral oil options. You can also use warm water in the reusable enema containers. They all work. But since saline can sometimes be irritating, so try a mineral oil or water enema instead.  Here are commonly recommended constipation medications that do not work well for post-surgery constipation: Docusate: Docusate (generic name) most commonly referred to as Colace (brand name) is not really a laxative, but is classified as a stool softener. Although this medication is commonly prescribed, it is not recommended for several reasons: 1) there is no good medical evidence that it works 2) even if it has an effect, which is very questionable, it is minimal and cannot combat the intestinal slowing caused by the opioid medications. Skip docusate to save money and space in your pillbox for something more effective.  PEG: Miralax (brand name) is basically a chemical called polyethylene glycol (PEG) and it has gained tremendous popularity as a laxative. This product is an "osmotic laxative" meaning it works by pulling water into the stool, making it softer. This is very similar to the action of natural fiber in foods and supplements. Therefore, the effect seen by this medication is not  immediate, causing a bowel movement in a day or more. Is this medication strong enough to battle the constipation related to having an operation? Maybe for some people not prone to constipation. But for most people, other laxatives are better to prevent constipation after surgery.   AMBULATORY SURGERY  DISCHARGE INSTRUCTIONS   The drugs that you were given will stay in your system until tomorrow so for the next 24 hours you should not:  Drive an automobile Make any legal decisions Drink any alcoholic beverage   You may resume regular meals tomorrow.  Today it is better to start with liquids and gradually work up to solid foods.  You may eat anything you prefer, but it is better to start with liquids, then soup and crackers, and gradually work up to solid foods.   Please notify your doctor immediately if you have any unusual bleeding, trouble breathing, redness and pain at the surgery site, drainage, fever, or pain not relieved by medication.    Your post-operative visit with Dr.                                       is: Date:                        Time:    Please call to schedule your post-operative visit.  Additional Instructions:

## 2023-09-04 NOTE — Anesthesia Procedure Notes (Signed)
Procedure Name: Intubation Date/Time: 09/04/2023 8:55 AM  Performed by: Omer Jack, CRNAPre-anesthesia Checklist: Patient identified, Patient being monitored, Timeout performed, Emergency Drugs available and Suction available Patient Re-evaluated:Patient Re-evaluated prior to induction Oxygen Delivery Method: Circle system utilized Preoxygenation: Pre-oxygenation with 100% oxygen Induction Type: IV induction Ventilation: Mask ventilation without difficulty Laryngoscope Size: McGraph Grade View: Grade I Tube type: Oral Tube size: 6.5 mm Number of attempts: 1 Airway Equipment and Method: Stylet and Video-laryngoscopy Placement Confirmation: ETT inserted through vocal cords under direct vision, positive ETCO2 and breath sounds checked- equal and bilateral Secured at: 21 cm Tube secured with: Tape Dental Injury: Teeth and Oropharynx as per pre-operative assessment

## 2023-09-04 NOTE — Interval H&P Note (Signed)
History and Physical Interval Note:  09/04/2023 8:16 AM  Tracy Nicholson  has presented today for surgery, with the diagnosis of pelvic pain.  The various methods of treatment have been discussed with the patient and family. After consideration of risks, benefits and other options for treatment, the patient has consented to  Procedure(s) with comments: XI ROBOTIC ASSISTED TOTAL HYSTERECTOMY WITH BILATERAL SALPINGO OOPHORECTOMY (Bilateral) - RNFA CYSTOSCOPY WITH HYDRODISTENTION (N/A) as a surgical intervention.  The patient's history has been reviewed, patient examined, no change in status, stable for surgery.  I have reviewed the patient's chart and labs.  Questions were answered to the patient's satisfaction.     Tracy Nicholson

## 2023-09-04 NOTE — Transfer of Care (Signed)
Immediate Anesthesia Transfer of Care Note  Patient: [name]  Procedure(s) Performed: XI ROBOTIC ASSISTED TOTAL HYSTERECTOMY WITH BILATERAL SALPINGO OOPHORECTOMY (Bilateral: Abdomen) CYSTOSCOPY WITH HYDRODISTENTION (Bladder)  Patient Location: PACU  Anesthesia Type:General  Level of Consciousness: drowsy and patient cooperative  Airway & Oxygen Therapy: Patient Spontanous Breathing and Patient connected to face mask oxygen  Post-op Assessment: Report given to RN and Post -op Vital signs reviewed and stable  Post vital signs: Reviewed and stable  Last Vitals:  Vitals Value Taken Time  BP 155/71 09/04/23 1100  Temp 36.2 C 09/04/23 1100  Pulse 68 09/04/23 1108  Resp 15 09/04/23 1108  SpO2 100 % 09/04/23 1108  Vitals shown include unfiled device data.  Last Pain:  Vitals:   09/04/23 0641  TempSrc: Temporal  PainSc: 4          Complications: No notable events documented.

## 2023-09-05 ENCOUNTER — Encounter: Payer: Self-pay | Admitting: Obstetrics and Gynecology

## 2023-09-06 LAB — SURGICAL PATHOLOGY

## 2023-10-28 DIAGNOSIS — H353111 Nonexudative age-related macular degeneration, right eye, early dry stage: Secondary | ICD-10-CM | POA: Diagnosis not present

## 2023-10-28 DIAGNOSIS — H353131 Nonexudative age-related macular degeneration, bilateral, early dry stage: Secondary | ICD-10-CM | POA: Diagnosis not present

## 2023-10-28 DIAGNOSIS — H353121 Nonexudative age-related macular degeneration, left eye, early dry stage: Secondary | ICD-10-CM | POA: Diagnosis not present

## 2023-10-28 DIAGNOSIS — Z961 Presence of intraocular lens: Secondary | ICD-10-CM | POA: Diagnosis not present

## 2024-02-19 DIAGNOSIS — M129 Arthropathy, unspecified: Secondary | ICD-10-CM | POA: Diagnosis not present

## 2024-02-19 DIAGNOSIS — I10 Essential (primary) hypertension: Secondary | ICD-10-CM | POA: Diagnosis not present

## 2024-02-19 DIAGNOSIS — K589 Irritable bowel syndrome without diarrhea: Secondary | ICD-10-CM | POA: Diagnosis not present

## 2024-02-19 DIAGNOSIS — R7302 Impaired glucose tolerance (oral): Secondary | ICD-10-CM | POA: Diagnosis not present

## 2024-02-19 DIAGNOSIS — E782 Mixed hyperlipidemia: Secondary | ICD-10-CM | POA: Diagnosis not present

## 2024-02-20 DIAGNOSIS — E782 Mixed hyperlipidemia: Secondary | ICD-10-CM | POA: Diagnosis not present

## 2024-02-20 DIAGNOSIS — R7302 Impaired glucose tolerance (oral): Secondary | ICD-10-CM | POA: Diagnosis not present

## 2024-04-07 ENCOUNTER — Encounter (INDEPENDENT_AMBULATORY_CARE_PROVIDER_SITE_OTHER): Payer: Self-pay

## 2024-05-04 DIAGNOSIS — H43813 Vitreous degeneration, bilateral: Secondary | ICD-10-CM | POA: Diagnosis not present

## 2024-05-04 DIAGNOSIS — H353131 Nonexudative age-related macular degeneration, bilateral, early dry stage: Secondary | ICD-10-CM | POA: Diagnosis not present

## 2024-05-04 DIAGNOSIS — H353130 Nonexudative age-related macular degeneration, bilateral, stage unspecified: Secondary | ICD-10-CM | POA: Diagnosis not present

## 2024-05-04 DIAGNOSIS — Z961 Presence of intraocular lens: Secondary | ICD-10-CM | POA: Diagnosis not present

## 2024-09-02 DIAGNOSIS — I10 Essential (primary) hypertension: Secondary | ICD-10-CM | POA: Diagnosis not present

## 2024-09-02 DIAGNOSIS — R7302 Impaired glucose tolerance (oral): Secondary | ICD-10-CM | POA: Diagnosis not present

## 2024-09-02 DIAGNOSIS — Z0000 Encounter for general adult medical examination without abnormal findings: Secondary | ICD-10-CM | POA: Diagnosis not present

## 2024-09-02 DIAGNOSIS — M129 Arthropathy, unspecified: Secondary | ICD-10-CM | POA: Diagnosis not present

## 2024-09-02 DIAGNOSIS — K589 Irritable bowel syndrome without diarrhea: Secondary | ICD-10-CM | POA: Diagnosis not present

## 2024-09-02 DIAGNOSIS — E782 Mixed hyperlipidemia: Secondary | ICD-10-CM | POA: Diagnosis not present

## 2024-10-26 DIAGNOSIS — Z961 Presence of intraocular lens: Secondary | ICD-10-CM | POA: Diagnosis not present

## 2024-10-26 DIAGNOSIS — H353122 Nonexudative age-related macular degeneration, left eye, intermediate dry stage: Secondary | ICD-10-CM | POA: Diagnosis not present

## 2024-10-26 DIAGNOSIS — H353111 Nonexudative age-related macular degeneration, right eye, early dry stage: Secondary | ICD-10-CM | POA: Diagnosis not present
